# Patient Record
Sex: Female | Born: 1960 | Race: White | Hispanic: No | Marital: Married | Smoking: Never smoker
Health system: Southern US, Community
[De-identification: ages and names within clinical notes are randomized; demographics above are authoritative.]

## PROBLEM LIST (undated history)

## (undated) DIAGNOSIS — I10 Essential (primary) hypertension: Secondary | ICD-10-CM

## (undated) DIAGNOSIS — M1990 Unspecified osteoarthritis, unspecified site: Secondary | ICD-10-CM

## (undated) DIAGNOSIS — H268 Other specified cataract: Secondary | ICD-10-CM

## (undated) DIAGNOSIS — Z8742 Personal history of other diseases of the female genital tract: Secondary | ICD-10-CM

## (undated) DIAGNOSIS — D259 Leiomyoma of uterus, unspecified: Secondary | ICD-10-CM

## (undated) DIAGNOSIS — K219 Gastro-esophageal reflux disease without esophagitis: Secondary | ICD-10-CM

## (undated) DIAGNOSIS — Z8669 Personal history of other diseases of the nervous system and sense organs: Secondary | ICD-10-CM

## (undated) DIAGNOSIS — L900 Lichen sclerosus et atrophicus: Secondary | ICD-10-CM

## (undated) DIAGNOSIS — N949 Unspecified condition associated with female genital organs and menstrual cycle: Secondary | ICD-10-CM

## (undated) DIAGNOSIS — R112 Nausea with vomiting, unspecified: Secondary | ICD-10-CM

## (undated) DIAGNOSIS — N840 Polyp of corpus uteri: Secondary | ICD-10-CM

## (undated) DIAGNOSIS — N951 Menopausal and female climacteric states: Secondary | ICD-10-CM

## (undated) DIAGNOSIS — E785 Hyperlipidemia, unspecified: Secondary | ICD-10-CM

## (undated) DIAGNOSIS — Z9889 Other specified postprocedural states: Secondary | ICD-10-CM

## (undated) HISTORY — DX: Essential (primary) hypertension: I10

## (undated) HISTORY — PX: WISDOM TOOTH EXTRACTION: SHX21

## (undated) HISTORY — DX: Unspecified condition associated with female genital organs and menstrual cycle: N94.9

## (undated) HISTORY — DX: Lichen sclerosus et atrophicus: L90.0

## (undated) HISTORY — PX: LAPAROSCOPIC CHOLECYSTECTOMY: SUR755

## (undated) HISTORY — DX: Hyperlipidemia, unspecified: E78.5

## (undated) HISTORY — PX: OTHER SURGICAL HISTORY: SHX169

---

## 1998-08-19 ENCOUNTER — Encounter: Admission: RE | Admit: 1998-08-19 | Discharge: 1998-09-18 | Payer: Self-pay | Admitting: Family Medicine

## 1998-09-03 ENCOUNTER — Emergency Department (HOSPITAL_COMMUNITY): Admission: EM | Admit: 1998-09-03 | Discharge: 1998-09-03 | Payer: Self-pay | Admitting: Emergency Medicine

## 1998-09-03 ENCOUNTER — Encounter: Payer: Self-pay | Admitting: Emergency Medicine

## 2001-05-11 ENCOUNTER — Emergency Department (HOSPITAL_COMMUNITY): Admission: EM | Admit: 2001-05-11 | Discharge: 2001-05-11 | Payer: Self-pay | Admitting: Emergency Medicine

## 2001-05-13 ENCOUNTER — Emergency Department (HOSPITAL_COMMUNITY): Admission: EM | Admit: 2001-05-13 | Discharge: 2001-05-13 | Payer: Self-pay | Admitting: Emergency Medicine

## 2002-02-21 ENCOUNTER — Other Ambulatory Visit: Admission: RE | Admit: 2002-02-21 | Discharge: 2002-02-21 | Payer: Self-pay | Admitting: Obstetrics and Gynecology

## 2002-04-30 ENCOUNTER — Encounter: Payer: Self-pay | Admitting: Family Medicine

## 2002-04-30 ENCOUNTER — Encounter: Admission: RE | Admit: 2002-04-30 | Discharge: 2002-04-30 | Payer: Self-pay | Admitting: Family Medicine

## 2003-02-27 ENCOUNTER — Other Ambulatory Visit: Admission: RE | Admit: 2003-02-27 | Discharge: 2003-02-27 | Payer: Self-pay | Admitting: Obstetrics and Gynecology

## 2003-09-14 HISTORY — PX: APPENDECTOMY: SHX54

## 2004-03-02 ENCOUNTER — Other Ambulatory Visit: Admission: RE | Admit: 2004-03-02 | Discharge: 2004-03-02 | Payer: Self-pay | Admitting: Obstetrics and Gynecology

## 2004-07-24 ENCOUNTER — Encounter: Admission: RE | Admit: 2004-07-24 | Discharge: 2004-07-24 | Payer: Self-pay | Admitting: Obstetrics and Gynecology

## 2005-03-08 ENCOUNTER — Other Ambulatory Visit: Admission: RE | Admit: 2005-03-08 | Discharge: 2005-03-08 | Payer: Self-pay | Admitting: Obstetrics and Gynecology

## 2005-10-04 ENCOUNTER — Encounter: Admission: RE | Admit: 2005-10-04 | Discharge: 2005-10-04 | Payer: Self-pay | Admitting: Obstetrics and Gynecology

## 2006-03-14 ENCOUNTER — Other Ambulatory Visit: Admission: RE | Admit: 2006-03-14 | Discharge: 2006-03-14 | Payer: Self-pay | Admitting: Obstetrics & Gynecology

## 2006-10-05 ENCOUNTER — Encounter: Admission: RE | Admit: 2006-10-05 | Discharge: 2006-10-05 | Payer: Self-pay | Admitting: Obstetrics and Gynecology

## 2007-03-16 ENCOUNTER — Other Ambulatory Visit: Admission: RE | Admit: 2007-03-16 | Discharge: 2007-03-16 | Payer: Self-pay | Admitting: Obstetrics and Gynecology

## 2007-11-08 ENCOUNTER — Encounter: Admission: RE | Admit: 2007-11-08 | Discharge: 2007-11-08 | Payer: Self-pay | Admitting: Obstetrics and Gynecology

## 2008-09-10 ENCOUNTER — Other Ambulatory Visit: Admission: RE | Admit: 2008-09-10 | Discharge: 2008-09-10 | Payer: Self-pay | Admitting: Obstetrics and Gynecology

## 2010-07-24 ENCOUNTER — Encounter: Admission: RE | Admit: 2010-07-24 | Discharge: 2010-07-24 | Payer: Self-pay | Admitting: Obstetrics and Gynecology

## 2010-10-26 ENCOUNTER — Ambulatory Visit (HOSPITAL_COMMUNITY)
Admission: RE | Admit: 2010-10-26 | Discharge: 2010-10-26 | Disposition: A | Discharge status: Home / Self Care | Payer: BC Managed Care – PPO | Source: Ambulatory Visit | Attending: Obstetrics and Gynecology | Admitting: Obstetrics and Gynecology

## 2010-10-26 ENCOUNTER — Other Ambulatory Visit: Payer: Self-pay | Admitting: Obstetrics and Gynecology

## 2010-10-26 DIAGNOSIS — N840 Polyp of corpus uteri: Secondary | ICD-10-CM | POA: Insufficient documentation

## 2010-10-26 DIAGNOSIS — E43 Unspecified severe protein-calorie malnutrition: Secondary | ICD-10-CM | POA: Insufficient documentation

## 2010-10-26 LAB — CBC
HCT: 38.7 % (ref 36.0–46.0)
Hemoglobin: 12.1 g/dL (ref 12.0–15.0)
MCH: 24.9 pg — ABNORMAL LOW (ref 26.0–34.0)
MCV: 79.6 fL (ref 78.0–100.0)
RBC: 4.86 MIL/uL (ref 3.87–5.11)
WBC: 8.6 10*3/uL (ref 4.0–10.5)

## 2010-10-26 LAB — BASIC METABOLIC PANEL
CO2: 28 mEq/L (ref 19–32)
Chloride: 99 mEq/L (ref 96–112)
GFR calc non Af Amer: >60 mL/min (ref >60)
Glucose, Bld: 100 mg/dL — ABNORMAL HIGH (ref 70–99)
Potassium: 4.7 mEq/L (ref 3.5–5.1)
Sodium: 136 mEq/L (ref 135–145)

## 2010-10-28 NOTE — Op Note (Signed)
Sara Lyons, Sara Lyons               ACCOUNT NO.:  [id number]  MEDICAL RECORD NO.:  [id number]           PATIENT TYPE:  O  LOCATION:  [facility]                          FAC[facility]:  [facility]  PHYSICIAN:  Cynthia P. Romine, M.D.DATE OF BIRTH:  12-20-1960  DATE OF PROCEDURE:  10/26/2010 DATE OF DISCHARGE:                              OPERATIVE REPORT   PREOPERATIVE DIAGNOSES:  Menorrhagia, known endometrial polyps seen on sonohysterogram.  POSTOPERATIVE DIAGNOSIS:  Menorrhagia, known endometrial polyps seen on sonohysterogram.  PATHOLOGY:  Pending.  PROCEDURE:  Hysteroscopy with resection of polyps and dilation and curettage.  SURGEON:  Cynthia P. Romine, MD  ANESTHESIA:  General by LMA.  ESTIMATED BLOOD LOSS:  Minimal.  COMPLICATIONS:  None.  GLYCINE DEFICIT:  165 mL.  PROCEDURE IN DETAIL:  The patient was taken to the operating room and after the induction of adequate general anesthesia by LMA, she was placed in dorsal lithotomy position and prepped and draped in the usual fashion.  A posterior weighted and anterior Sims retractor were placed. The cervix was quite posterior leaf pacing and the vagina was very long and the Sims retractor would not reach to the cervix.  Therefore, it was removed.  A Deaver was placed and the cervix could be visualized and was grasped on its anterior lip.  It did not descend very much at all and would not move forward from its posterior facing position making the access to the cervical os somewhat difficult.  The uterus was sounded to 10 cm.  In order to dilate the cervix, there needed to be pressure placed towards the floor on the posterior weighted retractor, so that the os would come into view so that the dilators could be placed into the cervix and pointed anteriorly as the uterus was anteflexed.  With difficulty, the cervix was dilated to #31 Shawnie Pons.  The operative scope was introduced and hysteroscopy was done.  There were multiple endometrial  polyps present.  Photographic documentation was taken.  Most of them were on the anterior surface of the fundus and as the polyps were removed in pieces, it was necessary to withdraw the scope through the cervix in order to get the pieces of these rather large polyps out through the scope, which meant that was each passed with the cautery. It was necessary to withdraw the scope, then put the posterior weighted retractor back and pulled down the posterior weighted retractor, bring the cervix as forward as possible to reinsert the scope and remove the posterior weighted retractor, angle the scope anteriorly, and do another pass.  Using this technique and multiple passes, the polyps were removed and the endometrial cavity appeared clean.  Photographic documentation was taken at the end of the resection.  The scope was withdrawn.  Gentle sharp curettage was done.  The scope was then reinserted one more time.  The cavity was brushed clean with the cautery just removing small polyp fragments, and the procedure was terminated.  The scope waswithdrawn.  The instruments removed from the vagina and the procedure was terminated.  The patient tolerated it well.  Went in satisfactory condition to post anesthesia  recovery.     Cynthia P. Romine, M.D.     CPR/MEDQ  D:  10/26/2010  T:  10/27/2010  Job:  [id number]  Electronically Signed by Meredeth Ide M.D. on 10/28/2010 10:38:17 AM

## 2011-10-15 HISTORY — PX: COLONOSCOPY: SHX174

## 2012-06-12 ENCOUNTER — Other Ambulatory Visit: Payer: Self-pay | Admitting: Obstetrics and Gynecology

## 2012-06-12 DIAGNOSIS — Z1231 Encounter for screening mammogram for malignant neoplasm of breast: Secondary | ICD-10-CM

## 2012-06-30 ENCOUNTER — Ambulatory Visit
Admission: RE | Admit: 2012-06-30 | Discharge: 2012-06-30 | Disposition: A | Discharge status: Home / Self Care | Payer: BC Managed Care – PPO | Source: Ambulatory Visit | Attending: Obstetrics and Gynecology | Admitting: Obstetrics and Gynecology

## 2012-06-30 DIAGNOSIS — Z1231 Encounter for screening mammogram for malignant neoplasm of breast: Secondary | ICD-10-CM

## 2013-10-11 ENCOUNTER — Ambulatory Visit: Payer: Self-pay | Admitting: Nurse Practitioner

## 2013-10-29 ENCOUNTER — Ambulatory Visit (INDEPENDENT_AMBULATORY_CARE_PROVIDER_SITE_OTHER): Payer: BC Managed Care – PPO | Admitting: Nurse Practitioner

## 2013-10-29 ENCOUNTER — Encounter: Payer: Self-pay | Admitting: Nurse Practitioner

## 2013-10-29 VITALS — BP 130/82 | HR 88 | Ht 63.5 in | Wt 254.0 lb

## 2013-10-29 DIAGNOSIS — Z0000 Encounter for general adult medical examination without abnormal findings: Secondary | ICD-10-CM

## 2013-10-29 DIAGNOSIS — Z01419 Encounter for gynecological examination (general) (routine) without abnormal findings: Secondary | ICD-10-CM

## 2013-10-29 LAB — POCT URINALYSIS DIPSTICK
Bilirubin, UA: NEGATIVE
Blood, UA: NEGATIVE
Glucose, UA: NEGATIVE
Ketones, UA: NEGATIVE
LEUKOCYTES UA: NEGATIVE
Nitrite, UA: NEGATIVE
PH UA: 5
PROTEIN UA: NEGATIVE
UROBILINOGEN UA: NEGATIVE

## 2013-10-29 MED ORDER — MEDROXYPROGESTERONE ACETATE 10 MG PO TABS: 10.0000 mg | ORAL_TABLET | Freq: Every day | ORAL | Status: DC

## 2013-10-29 NOTE — Progress Notes (Signed)
Patient ID: Sara Lyons, female   DOB: 1961-04-11, 53 y.o.   MRN: [id number] 52 y.o. G0P0 Married Caucasian Fe here for annual exam.  Menses for this past year ws regular for about 2-3 months, then started skipping to every 3 months. LMP end of October.  Then Feb. 9 th another cycle. That lasted a 7 days and very heavy 4 days. Super pad and tampon changing often.  Associated breast tenderness, some PMS.  Still some vaso symptoms at night has not needed Estroven.  Adopted son is now age 36 and is having some health issues.    Patient's last menstrual period was 10/22/2013.          Sexually active: yes  The current method of family planning is post menopausal status.    Exercising: yes  Gym/ health club routine includes yoga.  Once weekly at the gym and 3-4 times per week at home. Smoker:  no  Health Maintenance: Pap:  10/04/11, WNL, neg HR HPV MMG:  06/30/12, Bi-Rads 1: negative Colonoscopy:  10/2011, diverticula, repeat in 5 years BMD: none TDaP:  UTD Labs:  PCP   Urine:  negative, pH 5.0   reports that she has never smoked. She has never used smokeless tobacco. She reports that she does not drink alcohol or use illicit drugs.  Past Medical History  Diagnosis Date  . Migraine   . Hypertension   . Hyperlipidemia     Past Surgical History  Procedure Laterality Date  . Cholecystectomy, laparoscopic      with nephrectomy  . Nephrectomy  11/21/08    with cholecystectomy  . Hysteroscopy  10/26/10    D&C, polyp resection, benign path  . Pelvic laparoscopy  11/21/08    exp lap with Left salpingo-oophorectomy  . Appendectomy  2005    Current Outpatient Prescriptions  Medication Sig Dispense Refill  . Multiple Vitamins-Minerals (MULTI FOR HER) PACK Take 1 each by mouth daily.      . pravastatin (PRAVACHOL) 20 MG tablet Take 1 tablet by mouth daily.      . traMADol (ULTRAM) 50 MG tablet Take 1 tablet by mouth daily as needed.      . triamterene-hydrochlorothiazide (MAXZIDE-25) 37.5-25  MG per tablet Take 1 tablet by mouth daily.      . medroxyPROGESTERone (PROVERA) 10 MG tablet Take 1 tablet (10 mg total) by mouth daily.  10 tablet  6   No current facility-administered medications for this visit.    Family History  Problem Relation Age of Onset  . Breast cancer Mother     after 25  . Esophageal cancer Mother 49  . Osteoporosis Mother   . Hypertension Mother   . Hypertension Father   . Heart attack Father   . Crohn's disease Sister   . Diabetes Sister   . Osteoporosis Maternal Grandmother     ROS:  Pertinent items are noted in HPI.  Otherwise, a comprehensive ROS was negative.  Exam:   BP 130/82  Pulse 88  Ht 5' 3.5" (1.613 m)  Wt 254 lb (115.214 kg)  BMI 44.28 kg/m2  LMP 10/22/2013 Height: 5' 3.5" (161.3 cm)  Ht Readings from Last 3 Encounters:  10/29/13 5' 3.5" (1.613 m)    General appearance: alert, cooperative and appears stated age Head: Normocephalic, without obvious abnormality, atraumatic Neck: no adenopathy, supple, symmetrical, trachea midline and thyroid normal to inspection and palpation Lungs: clear to auscultation bilaterally Breasts: normal appearance, no masses or tenderness Heart: regular rate and  rhythm Abdomen: soft, non-tender; no masses,  no organomegaly Extremities: extremities normal, atraumatic, no cyanosis or edema Skin: Skin color, texture, turgor normal. No rashes or lesions Lymph nodes: Cervical, supraclavicular, and axillary nodes normal. No abnormal inguinal nodes palpated Neurologic: Grossly normal   Pelvic: External genitalia:  no lesions              Urethra:  normal appearing urethra with no masses, tenderness or lesions              Bartholin's and Skene's: normal                 Vagina: normal appearing vagina with normal color and discharge, no lesions              Cervix: anteverted              Pap taken: no Bimanual Exam:  Uterus:  normal size, contour, position, consistency, mobility, non-tender               Adnexa: no mass, fullness, tenderness               Rectovaginal: Confirms               Anus:  normal sphincter tone, no lesions  A:  Well Woman with normal exam  Perimenopausal consistent with irregular menses  History of infertility secondary to endometriosis  S/P Exploratory lap W/ LSO secondary to dermoid cyst and endometriosis. 11/2008    P:   Pap smear as per guidelines   Mammogram due now and will schedule  Provera 10 mg to take every 60 days if no menses on her own - hopefully this will prevent such long and heavy bleeding on her own.  She will keep menstrual calendar and if no menses to call back after 2 rounds of Provera.  Counseled on breast self exam, mammography screening, adequate intake of calcium and vitamin D, diet and exercise return annually or prn  An After Visit Summary was printed and given to the patient.

## 2013-10-29 NOTE — Patient Instructions (Addendum)

## 2013-10-30 NOTE — Progress Notes (Signed)
Encounter reviewed by Dr. Shenelle Klas Silva.  

## 2014-04-23 ENCOUNTER — Other Ambulatory Visit: Payer: Self-pay

## 2014-04-23 DIAGNOSIS — Z1231 Encounter for screening mammogram for malignant neoplasm of breast: Secondary | ICD-10-CM

## 2014-04-25 ENCOUNTER — Ambulatory Visit
Admission: RE | Admit: 2014-04-25 | Discharge: 2014-04-25 | Disposition: A | Discharge status: Home / Self Care | Payer: BC Managed Care – PPO | Source: Ambulatory Visit

## 2014-04-25 DIAGNOSIS — Z1231 Encounter for screening mammogram for malignant neoplasm of breast: Secondary | ICD-10-CM

## 2014-11-04 ENCOUNTER — Ambulatory Visit (INDEPENDENT_AMBULATORY_CARE_PROVIDER_SITE_OTHER): Payer: BC Managed Care – PPO | Admitting: Nurse Practitioner

## 2014-11-04 ENCOUNTER — Encounter: Payer: Self-pay | Admitting: Nurse Practitioner

## 2014-11-04 VITALS — BP 136/60 | HR 64 | Resp 16 | Ht 63.75 in | Wt 219.2 lb

## 2014-11-04 DIAGNOSIS — Z0000 Encounter for general adult medical examination without abnormal findings: Secondary | ICD-10-CM

## 2014-11-04 DIAGNOSIS — Z01419 Encounter for gynecological examination (general) (routine) without abnormal findings: Secondary | ICD-10-CM

## 2014-11-04 LAB — POCT URINALYSIS DIPSTICK
BILIRUBIN UA: NEGATIVE
GLUCOSE UA: NEGATIVE
KETONES UA: NEGATIVE
LEUKOCYTES UA: NEGATIVE
Nitrite, UA: NEGATIVE
PH UA: 5
Protein, UA: NEGATIVE
RBC UA: NEGATIVE
Urobilinogen, UA: NEGATIVE

## 2014-11-04 NOTE — Progress Notes (Signed)
54 y.o. G0P0 Married  Caucasian Fe here for annual exam. She has a history of irregular menses from perimenopause.  Vaso symptoms are now improved.  currently amenorrhea since early August. Then did not take Provera - she thought of it then she forgot since she was at work and [employer] were at home.  Then on 10/27/14 had a 4 day menses on her own with very heavy bleeding X 1 day.  She will now put on her calender and menses app.  She is aware again if no menses in 3 months to do Provera challenge.  Patient's last menstrual period was 10/27/2014.          Sexually active: Yes.    The current method of family planning is none.    Exercising: Yes.    walking Smoker:  no  Health Maintenance: Pap:  10/04/11 WNL/negative HR HPV MMG:  04/25/14-normal Colonoscopy:  2/13-diverticular repeat in 5 years TDaP:  PCP-UTD Labs: PCP  Urine-Negative   reports that she has never smoked. She has never used smokeless tobacco. She reports that she drinks alcohol. She reports that she does not use illicit drugs.  Past Medical History  Diagnosis Date  . Migraine   . Hypertension   . Hyperlipidemia     Past Surgical History  Procedure Laterality Date  . Cholecystectomy, laparoscopic      with Left oophorectomy  . Hysteroscopy  10/26/10    D&C, polyp resection, benign path  . Appendectomy  2005  . Pelvic laparoscopy  11/21/08    exp lap with Left salpingo-oophorectomy    Current Outpatient Prescriptions  Medication Sig Dispense Refill  . Multiple Vitamins-Minerals (MULTI FOR HER) PACK Take 1 each by mouth daily.    . Omeprazole 20 MG TBEC daily.    . pravastatin (PRAVACHOL) 20 MG tablet Take 1 tablet by mouth daily.    Marland Kitchen triamterene-hydrochlorothiazide (MAXZIDE-25) 37.5-25 MG per tablet Take 1 tablet by mouth daily.    . medroxyPROGESTERone (PROVERA) 10 MG tablet Take 1 tablet (10 mg total) by mouth daily. (Patient not taking: Reported on 11/04/2014) 10 tablet 6  . traMADol (ULTRAM) 50 MG tablet Take 1  tablet by mouth daily as needed.     No current facility-administered medications for this visit.    Family History  Problem Relation Age of Onset  . Breast cancer Mother     after 67  . Esophageal cancer Mother 26  . Osteoporosis Mother   . Hypertension Mother   . Hypertension Father   . Heart attack Father   . Crohn's disease Sister   . Diabetes Sister   . Osteoporosis Maternal Grandmother     ROS:  Pertinent items are noted in HPI.  Otherwise, a comprehensive ROS was negative.  Exam:   BP 136/60 mmHg  Pulse 64  Resp 16  Ht 5' 3.75" (1.619 m)  Wt 219 lb 3.2 oz (99.428 kg)  BMI 37.93 kg/m2  LMP 10/27/2014 Height: 5' 3.75" (161.9 cm) Ht Readings from Last 3 Encounters:  11/04/14 5' 3.75" (1.619 m)  10/29/13 5' 3.5" (1.613 m)    General appearance: alert, cooperative and appears stated age Head: Normocephalic, without obvious abnormality, atraumatic Neck: no adenopathy, supple, symmetrical, trachea midline and thyroid normal to inspection and palpation Lungs: clear to auscultation bilaterally Breasts: normal appearance, no masses or tenderness Heart: regular rate and rhythm Abdomen: soft, non-tender; no masses,  no organomegaly Extremities: extremities normal, atraumatic, no cyanosis or edema Skin: Skin color, texture, turgor normal.  No rashes or lesions Lymph nodes: Cervical, supraclavicular, and axillary nodes normal. No abnormal inguinal nodes palpated Neurologic: Grossly normal   Pelvic: External genitalia:  no lesions              Urethra:  normal appearing urethra with no masses, tenderness or lesions              Bartholin's and Skene's: normal                 Vagina: normal appearing vagina with normal color and discharge, no lesions              Cervix: anteverted              Pap taken: Yes.   Bimanual Exam:  Uterus:  normal size, contour, position, consistency, mobility, non-tender              Adnexa: no mass, fullness, tenderness                Rectovaginal: Confirms               Anus:  normal sphincter tone, no lesions  Chaperone present: Np  A:  Well Woman with normal exam  Perimenopausal consistent with irregular menses History of infertility secondary to endometriosis S/P Exploratory lap W/ LSO secondary to dermoid cyst and endometriosis. 11/2008   P:   Reviewed health and wellness pertinent to exam  Pap smear taken today  Mammogram is due 8/16  Has Provera on hand at home - she will take Provera challenge if no menses in 3 months.  Counseled on breast self exam, mammography screening, adequate intake of calcium and vitamin D, diet and exercise, Kegel's exercises return annually or prn  An After Visit Summary was printed and given to the patient.

## 2014-11-04 NOTE — Patient Instructions (Signed)

## 2014-11-05 NOTE — Progress Notes (Signed)
Encounter reviewed by Dr. Brook Silva.  

## 2014-11-06 LAB — IPS PAP TEST WITH HPV

## 2015-11-10 ENCOUNTER — Ambulatory Visit (INDEPENDENT_AMBULATORY_CARE_PROVIDER_SITE_OTHER): Payer: BC Managed Care – PPO | Admitting: Nurse Practitioner

## 2015-11-10 ENCOUNTER — Encounter: Payer: Self-pay | Admitting: Nurse Practitioner

## 2015-11-10 VITALS — BP 120/72 | HR 88 | Ht 63.75 in | Wt 232.0 lb

## 2015-11-10 DIAGNOSIS — N951 Menopausal and female climacteric states: Secondary | ICD-10-CM

## 2015-11-10 DIAGNOSIS — Z0000 Encounter for general adult medical examination without abnormal findings: Secondary | ICD-10-CM

## 2015-11-10 DIAGNOSIS — Z01419 Encounter for gynecological examination (general) (routine) without abnormal findings: Secondary | ICD-10-CM

## 2015-11-10 LAB — POCT URINALYSIS DIPSTICK
Bilirubin, UA: NEGATIVE
Blood, UA: NEGATIVE
Glucose, UA: NEGATIVE
KETONES UA: NEGATIVE
LEUKOCYTES UA: NEGATIVE
NITRITE UA: NEGATIVE
PH UA: 6.5
PROTEIN UA: NEGATIVE
UROBILINOGEN UA: NEGATIVE

## 2015-11-10 NOTE — Patient Instructions (Addendum)

## 2015-11-10 NOTE — Progress Notes (Signed)
Patient ID: Sara Lyons, female   DOB: Nov 18, 1960, 55 y.o.   MRN: [id number]  55 y.o. G0P0000 Married  Caucasian Fe here for annual exam.  Still having a menses every 3-4 months. 4 cycles this past year.  Lasting normally 3-4 days.  This past 2 cycles very long at 8 days. And flow was heavy.  Last PUS was about 2010.  Also has PMS symptoms and this occurs 1-2 days prior and few days of cycle.  She has history of adenomyosis.  She has Provera on hand if she goes over 3- 4 months and know she will do Provera challenge and call.  Patient's last menstrual period was 09/16/2015 (exact date).          Sexually active: Yes.    The current method of family planning is none.    Exercising: Yes.    Gym/ health club routine includes yoga 2-3 times per week. Smoker:  no  Health Maintenance: Pap: 11/04/14, Negative with neg HR HPV MMG: 04/25/14, Bi-Rads 1: Negative Colonoscopy:  10/2011, diverticula, repeat in 5 years TDaP:  UTD - will get date from PCP Hep C and HIV: will do at PCP and send [place] results Labs: PCP takes care of all labs  Urine:  Negative   reports that she has never smoked. She has never used smokeless tobacco. She reports that she drinks alcohol. She reports that she does not use illicit drugs.  Past Medical History  Diagnosis Date  . Migraine   . Hypertension   . Hyperlipidemia     Past Surgical History  Procedure Laterality Date  . Cholecystectomy, laparoscopic      with Left oophorectomy  . Hysteroscopy  10/26/10    D&C, polyp resection, benign path  . Appendectomy  2005  . Pelvic laparoscopy  11/21/08    exp lap with Left salpingo-oophorectomy    Current Outpatient Prescriptions  Medication Sig Dispense Refill  . medroxyPROGESTERone (PROVERA) 10 MG tablet Take 1 tablet (10 mg total) by mouth daily. 10 tablet 6  . Multiple Vitamins-Minerals (MULTI FOR HER) PACK Take 1 each by mouth daily.    . Omeprazole 20 MG TBEC daily.    . pravastatin (PRAVACHOL) 20 MG tablet Take 1  tablet by mouth daily.    . traMADol (ULTRAM) 50 MG tablet Take 1 tablet by mouth daily as needed.    . triamterene-hydrochlorothiazide (MAXZIDE-25) 37.5-25 MG per tablet Take 1 tablet by mouth daily.     No current facility-administered medications for this visit.    Family History  Problem Relation Age of Onset  . Breast cancer Mother     after 40  . Esophageal cancer Mother 66  . Osteoporosis Mother   . Hypertension Mother   . Hypertension Father   . Heart attack Father   . Crohn's disease Sister   . Diabetes Sister   . Osteoporosis Maternal Grandmother     ROS:  Pertinent items are noted in HPI.  Otherwise, a comprehensive ROS was negative.  Exam:   BP 120/72 mmHg  Pulse 88  Ht 5' 3.75" (1.619 m)  Wt 232 lb (105.235 kg)  BMI 40.15 kg/m2  LMP 09/16/2015 (Exact Date) Height: 5' 3.75" (161.9 cm) Ht Readings from Last 3 Encounters:  11/10/15 5' 3.75" (1.619 m)  11/04/14 5' 3.75" (1.619 m)  10/29/13 5' 3.5" (1.613 m)    General appearance: alert, cooperative and appears stated age Head: Normocephalic, without obvious abnormality, atraumatic Neck: no adenopathy, supple, symmetrical, trachea  midline and thyroid normal to inspection and palpation Lungs: clear to auscultation bilaterally Breasts: normal appearance, no masses or tenderness Heart: regular rate and rhythm Abdomen: soft, non-tender; no masses,  no organomegaly Extremities: extremities normal, atraumatic, no cyanosis or edema Skin: Skin color, texture, turgor normal. No rashes or lesions Lymph nodes: Cervical, supraclavicular, and axillary nodes normal. No abnormal inguinal nodes palpated Neurologic: Grossly normal   Pelvic: External genitalia:  no lesions              Urethra:  normal appearing urethra with no masses, tenderness or lesions              Bartholin's and Skene's: normal                 Vagina: normal appearing vagina with normal color and discharge, no lesions              Cervix:  anteverted              Pap taken: No. Bimanual Exam:  Uterus:  normal size, contour, position, consistency, mobility, non-tender              Adnexa: no mass, fullness, tenderness               Rectovaginal: Confirms               Anus:  normal sphincter tone, no lesions  Chaperone present: no  A:  Well Woman with normal exam  Perimenopausal consistent with irregular menses History of infertility secondary to endometriosis S/P Exploratory lap W/ LSO secondary to dermoid cyst and endometriosis. 11/2008   P:   Reviewed health and wellness pertinent to exam  Pap smear as above  Mammogram is due and will call to schedule  I have discussed her cycles with Dr. Quincy Simmonds  Continue to monitor menses  She declines any need for med's for PMS  Counseled on breast self exam, mammography screening, adequate intake of calcium and vitamin D, diet and exercise return annually or prn  An After Visit Summary was printed and given to the patient.

## 2015-11-10 NOTE — Progress Notes (Signed)
Encounter reviewed by Dr. Toshiyuki Fredell Amundson C. Silva.  

## 2016-02-01 ENCOUNTER — Encounter (HOSPITAL_BASED_OUTPATIENT_CLINIC_OR_DEPARTMENT_OTHER): Payer: Self-pay

## 2016-02-01 ENCOUNTER — Emergency Department (HOSPITAL_BASED_OUTPATIENT_CLINIC_OR_DEPARTMENT_OTHER): Payer: BC Managed Care – PPO

## 2016-02-01 ENCOUNTER — Emergency Department (HOSPITAL_BASED_OUTPATIENT_CLINIC_OR_DEPARTMENT_OTHER)
Admission: EM | Admit: 2016-02-01 | Discharge: 2016-02-01 | Disposition: A | Discharge status: Home / Self Care | Payer: BC Managed Care – PPO | Attending: Emergency Medicine | Admitting: Emergency Medicine

## 2016-02-01 DIAGNOSIS — R0789 Other chest pain: Secondary | ICD-10-CM | POA: Insufficient documentation

## 2016-02-01 DIAGNOSIS — E785 Hyperlipidemia, unspecified: Secondary | ICD-10-CM | POA: Diagnosis not present

## 2016-02-01 DIAGNOSIS — I10 Essential (primary) hypertension: Secondary | ICD-10-CM | POA: Insufficient documentation

## 2016-02-01 DIAGNOSIS — Z79899 Other long term (current) drug therapy: Secondary | ICD-10-CM | POA: Insufficient documentation

## 2016-02-01 DIAGNOSIS — R51 Headache: Secondary | ICD-10-CM | POA: Diagnosis not present

## 2016-02-01 DIAGNOSIS — R079 Chest pain, unspecified: Secondary | ICD-10-CM

## 2016-02-01 DIAGNOSIS — R519 Headache, unspecified: Secondary | ICD-10-CM

## 2016-02-01 LAB — BASIC METABOLIC PANEL
ANION GAP: 9 (ref 5–15)
BUN: 17 mg/dL (ref 6–20)
CALCIUM: 9.1 mg/dL (ref 8.9–10.3)
CO2: 28 mmol/L (ref 22–32)
Chloride: 99 mmol/L — ABNORMAL LOW (ref 101–111)
Creatinine, Ser: 0.61 mg/dL (ref 0.44–1.00)
GFR calc Af Amer: >60 mL/min (ref >60)
GLUCOSE: 114 mg/dL — AB (ref 65–99)
Potassium: 3.2 mmol/L — ABNORMAL LOW (ref 3.5–5.1)
SODIUM: 136 mmol/L (ref 135–145)

## 2016-02-01 LAB — CBC
HCT: 38.8 % (ref 36.0–46.0)
HEMOGLOBIN: 12.8 g/dL (ref 12.0–15.0)
MCH: 27.5 pg (ref 26.0–34.0)
MCHC: 33 g/dL (ref 30.0–36.0)
MCV: 83.3 fL (ref 78.0–100.0)
Platelets: 256 10*3/uL (ref 150–400)
RBC: 4.66 MIL/uL (ref 3.87–5.11)
RDW: 14.7 % (ref 11.5–15.5)
WBC: 11.2 10*3/uL — ABNORMAL HIGH (ref 4.0–10.5)

## 2016-02-01 LAB — TROPONIN I

## 2016-02-01 MED ORDER — ASPIRIN 81 MG PO CHEW
324.0000 mg | CHEWABLE_TABLET | Freq: Once | ORAL | Status: AC
  Administered 2016-02-01: 324 mg via ORAL
  Filled 2016-02-01: qty 4

## 2016-02-01 MED ORDER — ACETAMINOPHEN 500 MG PO TABS
1000.0000 mg | ORAL_TABLET | Freq: Once | ORAL | Status: AC
  Administered 2016-02-01: 1000 mg via ORAL
  Filled 2016-02-01: qty 2

## 2016-02-01 MED ORDER — OMEPRAZOLE 20 MG PO CPDR: 20.0000 mg | DELAYED_RELEASE_CAPSULE | Freq: Two times a day (BID) | ORAL | Status: AC

## 2016-02-01 MED ORDER — GI COCKTAIL ~~LOC~~
30.0000 mL | Freq: Once | ORAL | Status: AC
  Administered 2016-02-01: 30 mL via ORAL
  Filled 2016-02-01: qty 30

## 2016-02-01 MED ORDER — FAMOTIDINE 20 MG PO TABS
20.0000 mg | ORAL_TABLET | Freq: Once | ORAL | Status: AC
  Administered 2016-02-01: 20 mg via ORAL
  Filled 2016-02-01: qty 1

## 2016-02-01 NOTE — ED Provider Notes (Signed)
CSN: [id number]     Arrival date & time 02/01/16  1435 History   First MD Initiated Contact with Patient 02/01/16 1643     Chief Complaint  Patient presents with  . Chest Pain     (Consider location/radiation/quality/duration/timing/severity/associated sxs/prior Treatment) HPI At [place] started feeling "yucky". Nausea, H/A. Stopped and ate b/c thought might be b/c had not eaten since breakfast (oatmeat, blueberries, banana). At 1:30 ate cheeze burger, diet Pepsi and fries. Got chest tightness that worsened about 20 minutes after ate. Drank a diet Pepsi to see if would help. Got radiation to left arm. Symptoms got bad quickly. Went from nausea to severe pain in few minutes. Now has eased but still feels some residual discomfort. Patient was well before symptoms started. She has no heart history. Past Medical History  Diagnosis Date  . Migraine   . Hypertension   . Hyperlipidemia    Past Surgical History  Procedure Laterality Date  . Cholecystectomy, laparoscopic      with Left oophorectomy  . Hysteroscopy  10/26/10    D&C, polyp resection, benign path  . Appendectomy  2005  . Pelvic laparoscopy  11/21/08    exp lap with Left salpingo-oophorectomy   Family History  Problem Relation Age of Onset  . Breast cancer Mother     after 24  . Esophageal cancer Mother 63  . Osteoporosis Mother   . Hypertension Mother   . Hypertension Father   . Heart attack Father   . Crohn's disease Sister   . Diabetes Sister   . Osteoporosis Maternal Grandmother    Social History  Substance Use Topics  . Smoking status: Never Smoker   . Smokeless tobacco: Never Used  . Alcohol Use: 0.0 oz/week    0 Standard drinks or equivalent per week     Comment: less than one   OB History    Gravida Para Term Preterm AB TAB SAB Ectopic Multiple Living   0 0 0 0 0 0 0 0 0 0      Review of Systems 10 Systems reviewed and are negative for acute change except as noted in the HPI.  Allergies   Demerol  Home Medications   Prior to Admission medications   Medication Sig Start Date End Date Taking? Authorizing Provider  medroxyPROGESTERone (PROVERA) 10 MG tablet Take 1 tablet (10 mg total) by mouth daily. 10/29/13   Kem Boroughs, FNP  Multiple Vitamins-Minerals (MULTI FOR HER) PACK Take 1 each by mouth daily.    Historical Provider, MD  omeprazole (PRILOSEC) 20 MG capsule Take 1 capsule (20 mg total) by mouth 2 (two) times daily before a meal. 02/01/16   Charlesetta Shanks, MD  Omeprazole 20 MG TBEC daily. 09/08/11   Historical Provider, MD  pravastatin (PRAVACHOL) 20 MG tablet Take 1 tablet by mouth daily. 10/01/13   Historical Provider, MD  traMADol (ULTRAM) 50 MG tablet Take 1 tablet by mouth daily as needed. 10/10/13   Historical Provider, MD  triamterene-hydrochlorothiazide (MAXZIDE-25) 37.5-25 MG per tablet Take 1 tablet by mouth daily. 10/01/13   Historical Provider, MD   BP 155/74 mmHg  Pulse 93  Temp(Src) 98.7 F (37.1 C) (Oral)  Resp 13  Ht 5\' 4"  (1.626 m)  Wt 235 lb (106.595 kg)  BMI 40.32 kg/m2  SpO2 98%  LMP 02/01/2015 (Within Months) Physical Exam  Constitutional: She is oriented to person, place, and time. She appears well-developed and well-nourished.  HENT:  Head: Normocephalic and atraumatic.  Mouth/Throat: Oropharynx is clear  and moist.  Eyes: EOM are normal. Pupils are equal, round, and reactive to light.  Neck: Neck supple.  Cardiovascular: Normal rate, regular rhythm, normal heart sounds and intact distal pulses.   Pulmonary/Chest: Effort normal and breath sounds normal. She exhibits no tenderness.  Abdominal: Soft. Bowel sounds are normal. She exhibits no distension. There is no tenderness.  Musculoskeletal: Normal range of motion. She exhibits no edema or tenderness.  Neurological: She is alert and oriented to person, place, and time. She has normal strength. Coordination normal. GCS eye subscore is 4. GCS verbal subscore is 5. GCS motor subscore is 6.   Skin: Skin is warm, dry and intact.  Psychiatric: She has a normal mood and affect.    ED Course  Procedures (including critical care time) Labs Review Labs Reviewed  BASIC METABOLIC PANEL - Abnormal; Notable for the following:    Potassium 3.2 (*)    Chloride 99 (*)    Glucose, Bld 114 (*)    All other components within normal limits  CBC - Abnormal; Notable for the following:    WBC 11.2 (*)    All other components within normal limits  TROPONIN I  TROPONIN I    Imaging Review Dg Chest 2 View  02/01/2016  CLINICAL DATA:  Central chest pain, pressure onset today. Pain radiating into left arm and neck. Some shortness of breath. EXAM: CHEST  2 VIEW COMPARISON:  Chest x-ray dated 11/20/2014. FINDINGS: Heart size is normal. Overall cardiomediastinal silhouette is stable in size and configuration. Lungs are clear. No evidence of pneumonia. No pleural effusion or pneumothorax seen. Mild degenerative spurring noted within the thoracic spine. No acute- appearing osseous abnormality. IMPRESSION: Lungs are clear and there is no evidence of acute cardiopulmonary abnormality. Electronically Signed   By: Franki Cabot M.D.   On: 02/01/2016 15:38   I have personally reviewed and evaluated these images and lab results as part of my medical decision-making.   EKG Interpretation   Date/Time:  Sunday Feb 01 2016 14:41:28 EDT Ventricular Rate:  90 PR Interval:  160 QRS Duration: 82 QT Interval:  338 QTC Calculation: 413 R Axis:   40 Text Interpretation:  Normal sinus rhythm normal. no change from old  Confirmed by Johnney Killian, MD, Jeannie Done [phone]) on 02/01/2016 4:56:47 PM      MDM   Final diagnoses:  Chest pain, unspecified chest pain type  Acute nonintractable headache, unspecified headache type   Patient developed chest pains outlined above. EKG does not show ischemic changes. 2 sets of cardiac enzymes are negative. At this time she does not appear to have acute ischemic event. I will have  the patient follow-up with her family doctor this week to determine necessity of follow-up stress testing. Patient also has history of reflux, consideration is given to an exacerbation with esophageal spasm. The patient will be advised to increase her omeprazole to twice daily for the next 2 weeks. Signs and symptoms for which to return are reviewed.    Charlesetta Shanks, MD 02/01/16 [phone]

## 2016-02-01 NOTE — ED Notes (Signed)
Pt c/o H/A "not migraine H/A".

## 2016-02-01 NOTE — ED Notes (Signed)
Pt reports chest tightness x 2 hours. Sts having centralized to left sided chest pain. Reports some nausea. Also sts some radiation into neck.

## 2016-02-01 NOTE — Discharge Instructions (Signed)
Nonspecific Chest Pain  Chest pain can be caused by many different conditions. There is always a chance that your pain could be related to something serious, such as a heart attack or a blood clot in your lungs. Chest pain can also be caused by conditions that are not life-threatening. If you have chest pain, it is very important to follow up with your health care provider. CAUSES  Chest pain can be caused by:  Heartburn.  Pneumonia or bronchitis.  Anxiety or stress.  Inflammation around your heart (pericarditis) or lung (pleuritis or pleurisy).  A blood clot in your lung.  A collapsed lung (pneumothorax). It can develop suddenly on its own (spontaneous pneumothorax) or from trauma to the chest.  Shingles infection (varicella-zoster virus).  Heart attack.  Damage to the bones, muscles, and cartilage that make up your chest wall. This can include:  Bruised bones due to injury.  Strained muscles or cartilage due to frequent or repeated coughing or overwork.  Fracture to one or more ribs.  Sore cartilage due to inflammation (costochondritis). RISK FACTORS  Risk factors for chest pain may include:  Activities that increase your risk for trauma or injury to your chest.  Respiratory infections or conditions that cause frequent coughing.  Medical conditions or overeating that can cause heartburn.  Heart disease or family history of heart disease.  Conditions or health behaviors that increase your risk of developing a blood clot.  Having had chicken pox (varicella zoster). SIGNS AND SYMPTOMS Chest pain can feel like:  Burning or tingling on the surface of your chest or deep in your chest.  Crushing, pressure, aching, or squeezing pain.  Dull or sharp pain that is worse when you move, cough, or take a deep breath.  Pain that is also felt in your back, neck, shoulder, or arm, or pain that spreads to any of these areas. Your chest pain may come and go, or it may stay  constant. DIAGNOSIS Lab tests or other studies may be needed to find the cause of your pain. Your health care provider may have you take a test called an ambulatory ECG (electrocardiogram). An ECG records your heartbeat patterns at the time the test is performed. You may also have other tests, such as:  Transthoracic echocardiogram (TTE). During echocardiography, sound waves are used to create a picture of all of the heart structures and to look at how blood flows through your heart.  Transesophageal echocardiogram (TEE).This is a more advanced imaging test that obtains images from inside your body. It allows your health care provider to see your heart in finer detail.  Cardiac monitoring. This allows your health care provider to monitor your heart rate and rhythm in real time.  Holter monitor. This is a portable device that records your heartbeat and can help to diagnose abnormal heartbeats. It allows your health care provider to track your heart activity for several days, if needed.  Stress tests. These can be done through exercise or by taking medicine that makes your heart beat more quickly.  Blood tests.  Imaging tests. TREATMENT  Your treatment depends on what is causing your chest pain. Treatment may include:  Medicines. These may include:  Acid blockers for heartburn.  Anti-inflammatory medicine.  Pain medicine for inflammatory conditions.  Antibiotic medicine, if an infection is present.  Medicines to dissolve blood clots.  Medicines to treat coronary artery disease.  Supportive care for conditions that do not require medicines. This may include:  Resting.  Applying heat  or cold packs to injured areas.  Limiting activities until pain decreases. HOME CARE INSTRUCTIONS  If you were prescribed an antibiotic medicine, finish it all even if you start to feel better.  Avoid any activities that bring on chest pain.  Do not use any tobacco products, including  cigarettes, chewing tobacco, or electronic cigarettes. If you need help quitting, ask your health care provider.  Do not drink alcohol.  Take medicines only as directed by your health care provider.  Keep all follow-up visits as directed by your health care provider. This is important. This includes any further testing if your chest pain does not go away.  If heartburn is the cause for your chest pain, you may be told to keep your head raised (elevated) while sleeping. This reduces the chance that acid will go from your stomach into your esophagus.  Make lifestyle changes as directed by your health care provider. These may include:  Getting regular exercise. Ask your health care provider to suggest some activities that are safe for you.  Eating a heart-healthy diet. A registered dietitian can help you to learn healthy eating options.  Maintaining a healthy weight.  Managing diabetes, if necessary.  Reducing stress. SEEK MEDICAL CARE IF:  Your chest pain does not go away after treatment.  You have a rash with blisters on your chest.  You have a fever. SEEK IMMEDIATE MEDICAL CARE IF:   Your chest pain is worse.  You have an increasing cough, or you cough up blood.  You have severe abdominal pain.  You have severe weakness.  You faint.  You have chills.  You have sudden, unexplained chest discomfort.  You have sudden, unexplained discomfort in your arms, back, neck, or jaw.  You have shortness of breath at any time.  You suddenly start to sweat, or your skin gets clammy.  You feel nauseous or you vomit.  You suddenly feel light-headed or dizzy.  Your heart begins to beat quickly, or it feels like it is skipping beats. These symptoms may represent a serious problem that is an emergency. Do not wait to see if the symptoms will go away. Get medical help right away. Call your local emergency services (911 in the [place] Do not drive yourself to the hospital.   This  information is not intended to replace advice given to you by your health care provider. Make sure you discuss any questions you have with your health care provider.   Document Released: 06/09/2005 Document Revised: 09/20/2014 Document Reviewed: 04/05/2014 Elsevier Interactive Patient Education 2016 [place] Headache Without Cause A headache is pain or discomfort felt around the head or neck area. The specific cause of a headache may not be found. There are many causes and types of headaches. A few common ones are:  Tension headaches.  Migraine headaches.  Cluster headaches.  Chronic daily headaches. HOME CARE INSTRUCTIONS  Watch your condition for any changes. Take these steps to help with your condition: Managing Pain  Take over-the-counter and prescription medicines only as told by your health care provider.  Lie down in a dark, quiet room when you have a headache.  If directed, apply ice to the head and neck area:  Put ice in a plastic bag.  Place a towel between your skin and the bag.  Leave the ice on for 20 minutes, 2-3 times per day.  Use a heating pad or hot shower to apply heat to the head and neck area as told by  your health care provider.  Keep lights dim if bright lights bother you or make your headaches worse. Eating and Drinking  Eat meals on a regular schedule.  Limit alcohol use.  Decrease the amount of caffeine you drink, or stop drinking caffeine. General Instructions  Keep all follow-up visits as told by your health care provider. This is important.  Keep a headache journal to help find out what may trigger your headaches. For example, write down:  What you eat and drink.  How much sleep you get.  Any change to your diet or medicines.  Try massage or other relaxation techniques.  Limit stress.  Sit up straight, and do not tense your muscles.  Do not use tobacco products, including cigarettes, chewing tobacco, or e-cigarettes.  If you need help quitting, ask your health care provider.  Exercise regularly as told by your health care provider.  Sleep on a regular schedule. Get 7-9 hours of sleep, or the amount recommended by your health care provider. SEEK MEDICAL CARE IF:   Your symptoms are not helped by medicine.  You have a headache that is different from the usual headache.  You have nausea or you vomit.  You have a fever. SEEK IMMEDIATE MEDICAL CARE IF:   Your headache becomes severe.  You have repeated vomiting.  You have a stiff neck.  You have a loss of vision.  You have problems with speech.  You have pain in the eye or ear.  You have muscular weakness or loss of muscle control.  You lose your balance or have trouble walking.  You feel faint or pass out.  You have confusion.   This information is not intended to replace advice given to you by your health care provider. Make sure you discuss any questions you have with your health care provider.   Document Released: 08/30/2005 Document Revised: 05/21/2015 Document Reviewed: 12/23/2014 Elsevier Interactive Patient Education [employer].

## 2016-03-13 HISTORY — PX: CATARACT EXTRACTION W/ INTRAOCULAR LENS IMPLANT: SHX1309

## 2016-09-13 DIAGNOSIS — N949 Unspecified condition associated with female genital organs and menstrual cycle: Secondary | ICD-10-CM

## 2016-09-13 DIAGNOSIS — L900 Lichen sclerosus et atrophicus: Secondary | ICD-10-CM

## 2016-09-13 HISTORY — DX: Unspecified condition associated with female genital organs and menstrual cycle: N94.9

## 2016-09-13 HISTORY — DX: Lichen sclerosus et atrophicus: L90.0

## 2016-09-22 ENCOUNTER — Other Ambulatory Visit: Payer: Self-pay | Admitting: Nurse Practitioner

## 2016-09-22 DIAGNOSIS — Z1231 Encounter for screening mammogram for malignant neoplasm of breast: Secondary | ICD-10-CM

## 2016-10-05 ENCOUNTER — Ambulatory Visit: Payer: BC Managed Care – PPO

## 2016-10-29 ENCOUNTER — Ambulatory Visit
Admission: RE | Admit: 2016-10-29 | Discharge: 2016-10-29 | Disposition: A | Discharge status: Home / Self Care | Payer: BC Managed Care – PPO | Source: Ambulatory Visit | Attending: Nurse Practitioner | Admitting: Nurse Practitioner

## 2016-10-29 DIAGNOSIS — Z1231 Encounter for screening mammogram for malignant neoplasm of breast: Secondary | ICD-10-CM

## 2016-11-09 ENCOUNTER — Encounter: Payer: Self-pay | Admitting: Nurse Practitioner

## 2016-11-09 NOTE — Progress Notes (Signed)
Patient ID: Sara Lyons, female   DOB: [birth year], 56 y.o.   MRN: [id number]  55 y.o. G0P0000 Married  Caucasian Fe here for annual exam.  Now amenorrhea since 11/2015.  She did not do Provera challenge but is willing to do now.  The vaso symptoms have almost resolved.  Last PUS was about 2010. She and husband have been very stressed dealing with her in laws death this past year.  Mother in law in the spring and father in law in the fall.   Patient's last menstrual period was 11/26/2015.          Sexually active: No.  The current method of family planning is post menopausal status.    Exercising: No.  walks a lot at her job Smoker:  no  Health Maintenance: Pap: 11/04/14, Negative with neg HR HPV  10/04/11, Negative with neg HR HPV MMG: 10/29/16, 3D, Bi-Rads 1: Negative Colonoscopy: 11/10/11, diverticula, repeat in 10 years TDaP: UTD Labs: PCP takes care of all labs (10/2016 in [place])   reports that she has never smoked. She has never used smokeless tobacco. She reports that she drinks alcohol. She reports that she does not use drugs.  Past Medical History:  Diagnosis Date  . Hyperlipidemia   . Hypertension   . Migraine     Past Surgical History:  Procedure Laterality Date  . APPENDECTOMY  2005  . CHOLECYSTECTOMY, LAPAROSCOPIC     with Left oophorectomy  . HYSTEROSCOPY  10/26/10   D&C, polyp resection, benign path  . PELVIC LAPAROSCOPY  11/21/08   exp lap with Left salpingo-oophorectomy    Current Outpatient Prescriptions  Medication Sig Dispense Refill  . amLODipine (NORVASC) 2.5 MG tablet Take 1 tablet by mouth daily.  0  . atorvastatin (LIPITOR) 20 MG tablet Take 20 mg by mouth daily.  0  . Multiple Vitamins-Minerals (MULTI FOR HER) PACK Take 1 each by mouth daily.    Marland Kitchen omeprazole (PRILOSEC) 20 MG capsule Take 1 capsule (20 mg total) by mouth 2 (two) times daily before a meal. 28 capsule 0  . traMADol (ULTRAM) 50 MG tablet Take 1 tablet by mouth daily as needed.     . triamterene-hydrochlorothiazide (MAXZIDE-25) 37.5-25 MG per tablet Take 1 tablet by mouth daily.     No current facility-administered medications for this visit.     Family History  Problem Relation Age of Onset  . Breast cancer Mother     after 65  . Esophageal cancer Mother 20  . Osteoporosis Mother   . Hypertension Mother   . Hypertension Father   . Heart attack Father   . Crohn's disease Sister   . Diabetes Sister   . Breast cancer Sister 80    stage III bilateral masterctomy, ? BRCA  . Osteoporosis Maternal Grandmother     ROS:  Pertinent items are noted in HPI.  Otherwise, a comprehensive ROS was negative.  Exam:   BP 120/78 (BP Location: Right Arm, Patient Position: Sitting, Cuff Size: Normal)   Pulse 76   Ht 5' 3"  (1.6 m)   Wt 240 lb (108.9 kg)   LMP 11/26/2015   BMI 42.51 kg/m  Height: 5' 3"  (160 cm) Ht Readings from Last 3 Encounters:  11/10/16 5' 3"  (1.6 m)  02/01/16 5' 4"  (1.626 m)  11/10/15 5' 3.75" (1.619 m)    General appearance: alert, cooperative and appears stated age Head: Normocephalic, without obvious abnormality, atraumatic Neck: no adenopathy, supple, symmetrical, trachea midline and thyroid  normal to inspection and palpation Lungs: clear to auscultation bilaterally Breasts: normal appearance, no masses or tenderness Heart: regular rate and rhythm Abdomen: soft, non-tender; no masses,  no organomegaly Extremities: extremities normal, atraumatic, no cyanosis or edema Skin: Skin color, texture, turgor normal. No rashes or lesions Lymph nodes: Cervical, supraclavicular, and axillary nodes normal. No abnormal inguinal nodes palpated Neurologic: Grossly normal   Pelvic: External genitalia:  no lesions              Urethra:  normal appearing urethra with no masses, tenderness or lesions              Bartholin's and Skene's: normal                 Vagina: normal appearing vagina with normal color and discharge, no lesions              Cervix:  anteverted              Pap taken: No. Bimanual Exam:  Uterus:  normal size, contour, position, consistency, mobility, non-tender              Adnexa: no mass, fullness, tenderness               Rectovaginal: Confirms               Anus:  normal sphincter tone, no lesions  Chaperone present: yes  A:  Well Woman with normal exam  Perimenopausal with current amenorrhea since 11/2015  At risk for endometria hyperplasia History of infertility secondary to endometriosis S/P Exploratory lap W/ LSO secondary to dermoid cyst and endometriosis. 11/2008  Grief reaction to father in law recent death and mother in law last spring death.  [place]: sister with breast cancer age 63 - bilateral mastectomy   P:   Reviewed health and wellness pertinent to exam  Pap smear not done  Mammogram is due 10/2017  Pt will find out from sister about the BRCA test results  She will do another Provera challenge after returns from [place] in a few weeks.  She has RX on hand.  She is aware if no bleeding to call back and we will proceed with [facility].  If bleeding and it is prolonged or heavy to also call back.   Counseled on breast self exam, mammography screening, adequate intake of calcium and vitamin D, diet and exercise, Kegel's exercises return annually or prn  An After Visit Summary was printed and given to the patient.

## 2016-11-10 ENCOUNTER — Ambulatory Visit (INDEPENDENT_AMBULATORY_CARE_PROVIDER_SITE_OTHER): Payer: BC Managed Care – PPO | Admitting: Nurse Practitioner

## 2016-11-10 ENCOUNTER — Encounter: Payer: Self-pay | Admitting: Nurse Practitioner

## 2016-11-10 VITALS — BP 120/78 | HR 76 | Ht 63.0 in | Wt 240.0 lb

## 2016-11-10 DIAGNOSIS — Z803 Family history of malignant neoplasm of breast: Secondary | ICD-10-CM | POA: Diagnosis not present

## 2016-11-10 DIAGNOSIS — F4320 Adjustment disorder, unspecified: Secondary | ICD-10-CM

## 2016-11-10 DIAGNOSIS — Z01411 Encounter for gynecological examination (general) (routine) with abnormal findings: Secondary | ICD-10-CM | POA: Diagnosis not present

## 2016-11-10 DIAGNOSIS — I10 Essential (primary) hypertension: Secondary | ICD-10-CM | POA: Insufficient documentation

## 2016-11-10 DIAGNOSIS — N912 Amenorrhea, unspecified: Secondary | ICD-10-CM | POA: Diagnosis not present

## 2016-11-10 NOTE — Progress Notes (Signed)
Encounter reviewed by Dr. Aundria Rud. I would recommend further evaluation to understand patient's menopausal status and to rule out endometrial hyperplasia.  Patient is morbidly obese and at risk. Will ask patient to return to do [facility], estradiol, pelvic ultrasound with possible endometrial biopsy.

## 2016-11-10 NOTE — Patient Instructions (Addendum)

## 2016-12-07 ENCOUNTER — Telehealth: Payer: Self-pay

## 2016-12-07 DIAGNOSIS — N912 Amenorrhea, unspecified: Secondary | ICD-10-CM

## 2016-12-07 NOTE — Telephone Encounter (Signed)
I have signed the orders for the [facility] and estradiol and closed this encounter.

## 2016-12-07 NOTE — Telephone Encounter (Addendum)
Call to patient for update on status of Provera challenge.

## 2016-12-07 NOTE — Telephone Encounter (Signed)
Spoke with patient. Patient states that she jsut returned from vacation and has not taken Provera yet. Advised patient Dr.Silva reviewed her OV on 11/10/2016 with Kem Boroughs, FNP and recommends further evaluation for menopausal status. Advised it is recommended that she start with lab work to check [facility] and Estradiol level first then additional recommendations will be made. Patient is agreeable. Lab appointment scheduled for 12/13/2016 at 10:30 am. Patient is agreeable to date and time.  Cc: Kem Boroughs, FNP   Routing to Dr.Silva for review.

## 2016-12-13 ENCOUNTER — Other Ambulatory Visit: Payer: BC Managed Care – PPO

## 2016-12-13 DIAGNOSIS — N912 Amenorrhea, unspecified: Secondary | ICD-10-CM

## 2016-12-14 LAB — ESTRADIOL: Estradiol: 24 pg/mL

## 2016-12-14 LAB — FOLLICLE STIMULATING HORMONE: FSH: 33 m[IU]/mL

## 2016-12-15 ENCOUNTER — Telehealth: Payer: Self-pay | Admitting: *Deleted

## 2016-12-15 DIAGNOSIS — N912 Amenorrhea, unspecified: Secondary | ICD-10-CM

## 2016-12-15 DIAGNOSIS — N951 Menopausal and female climacteric states: Secondary | ICD-10-CM

## 2016-12-15 NOTE — Telephone Encounter (Signed)
No answer, unable to leave message, phone rang and then disconnected.

## 2016-12-15 NOTE — Telephone Encounter (Signed)
-----   Message from Nunzio Cobbs, MD sent at 12/14/2016  7:37 PM EDT ----- Please report results to patient.  Labs look like perimenopause but are not convincing for menopause.  I do recommend proceeding with office ultrasound and endometrial biopsy to rule out endometrial hyperplasia.  Please send for precert and schedule with me.   [place]

## 2016-12-20 NOTE — Telephone Encounter (Signed)
Spoke with patient, advised of results and recommendations as seen below per Dr. Quincy Simmonds. Patient verbalizes understanding and is agreeable. Patient states she does not have her calendar with her for scheduling, will return call on 4/10 to schedule.    Cc: Lerry Liner, Kem Boroughs, NP

## 2016-12-20 NOTE — Telephone Encounter (Signed)
Thank you for the update.  Keep in your inbox and check back on 12/21/16 so we can follow through on patient's care.  Cc- Lamont Snowball

## 2016-12-21 NOTE — Telephone Encounter (Signed)
Left message to call Armon Orvis at [phone].  

## 2016-12-28 NOTE — Telephone Encounter (Signed)
Left message to call Dameion Briles at [phone].  

## 2016-12-30 NOTE — Telephone Encounter (Signed)
Patient will need a letter sent recommending a return visit for further discussion and evaluation for amenorrhea.   Cc- Lamont Snowball

## 2016-12-30 NOTE — Telephone Encounter (Signed)
Dr. Quincy Simmonds -have called and left message x2 with no return call, please advise?  Cc: Sara Lyons

## 2017-01-04 NOTE — Telephone Encounter (Signed)
Follow-up call to patient. Left message to call back to sally, nursing supervisor, regarding plans for recommended testing. (per ROI, can leave detailed message and voice mail confirms patients preferred name of "[name]").

## 2017-01-11 ENCOUNTER — Telehealth: Payer: Self-pay | Admitting: *Deleted

## 2017-01-11 NOTE — Telephone Encounter (Signed)
Will forward to Dr Quincy Simmonds.

## 2017-01-11 NOTE — Telephone Encounter (Signed)
Call previously routed to Dr Talbert Nan in error.  Routing to Dr Quincy Simmonds, supervising physician for Edman Circle FNP and has reviewed chart.

## 2017-01-11 NOTE — Telephone Encounter (Signed)
Opened in error. See phone note  From today on phone encounter from 12-15-16.  Routing to provider for final review.  Will close encounter.

## 2017-01-11 NOTE — Telephone Encounter (Signed)
Patient returned call. Very apologetic that she has missed our calls. Discussed recommendation to have consult with Dr Quincy Simmonds to review labs and amenorrhea for possible evaluation of endometrial lining.  Consult with possible endometrial biopsy scheduled for Wednesday 01-19-17 at 330. 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 Needs late afternoon and has few options until school is out. Instructed to take Motrin 800 mg one hour prior with food in preparation for possible biopsy.   Routing to provider for final review. Patient agreeable to disposition. Will close encounter.

## 2017-01-19 ENCOUNTER — Encounter: Payer: Self-pay | Admitting: Obstetrics and Gynecology

## 2017-01-19 ENCOUNTER — Ambulatory Visit (INDEPENDENT_AMBULATORY_CARE_PROVIDER_SITE_OTHER): Payer: BC Managed Care – PPO | Admitting: Obstetrics and Gynecology

## 2017-01-19 VITALS — BP 138/76 | HR 88 | Resp 16 | Ht 63.0 in | Wt 237.0 lb

## 2017-01-19 DIAGNOSIS — N951 Menopausal and female climacteric states: Secondary | ICD-10-CM | POA: Diagnosis not present

## 2017-01-19 DIAGNOSIS — N912 Amenorrhea, unspecified: Secondary | ICD-10-CM

## 2017-01-19 NOTE — Progress Notes (Signed)
GYNECOLOGY  VISIT   HPI: 56 y.o.   Married  Caucasian  female   G0P0000 with No LMP recorded (lmp unknown).   here for to review lab results and to possibly have endometrial biopsy.  LMP 11/16/15.   Labs E2 24 and FSH 33 on 12/13/16.   History of endometriosis.  Long history of irregular menses. Hx of infertility.   GYNECOLOGIC HISTORY: No LMP recorded (lmp unknown). Contraception:  Perimenopausal Menopausal hormone therapy:  none Last mammogram: 10-29-16 Density B/Neg/BiRads1:TBC Last pap smear: 11-04-14 Neg:Neg HR HPV; 10-04-11 Neg:Neg HR HPV        OB History    Gravida Para Term Preterm AB Living   0 0 0 0 0 0   SAB TAB Ectopic Multiple Live Births   0 0 0 0 0         Patient Active Problem List   Diagnosis Date Noted  . Family history of breast cancer in first degree relative 11/10/2016  . Essential hypertension 11/10/2016  . Adult situational stress disorder 11/10/2016  . Amenorrhea 11/10/2016    Past Medical History:  Diagnosis Date  . Hyperlipidemia   . Hypertension   . Migraine     Past Surgical History:  Procedure Laterality Date  . APPENDECTOMY  2005  . CHOLECYSTECTOMY, LAPAROSCOPIC     with Left oophorectomy  . HYSTEROSCOPY  10/26/10   D&C, polyp resection, benign path  . PELVIC LAPAROSCOPY  11/21/08   exp lap with Left salpingo-oophorectomy    Current Outpatient Prescriptions  Medication Sig Dispense Refill  . amLODipine (NORVASC) 2.5 MG tablet Take 1 tablet by mouth daily.  0  . atorvastatin (LIPITOR) 20 MG tablet Take 20 mg by mouth daily.  0  . Multiple Vitamins-Minerals (MULTI FOR HER) PACK Take 1 each by mouth daily.    Marland Kitchen omeprazole (PRILOSEC) 20 MG capsule Take 1 capsule (20 mg total) by mouth 2 (two) times daily before a meal. 28 capsule 0  . triamterene-hydrochlorothiazide (MAXZIDE-25) 37.5-25 MG per tablet Take 1 tablet by mouth daily.     No current facility-administered medications for this visit.      ALLERGIES: Demerol  [meperidine]  Family History  Problem Relation Age of Onset  . Breast cancer Mother     after 16  . Esophageal cancer Mother 61  . Osteoporosis Mother   . Hypertension Mother   . Hypertension Father   . Heart attack Father   . Crohn's disease Sister   . Diabetes Sister   . Breast cancer Sister 34    stage III bilateral masterctomy, ? BRCA  . Osteoporosis Maternal Grandmother     Social History   Social History  . Marital status: Married    Spouse name: N/A  . Number of children: 1  . Years of education: N/A   Occupational History  . Not on file.   Social History Main Topics  . Smoking status: Never Smoker  . Smokeless tobacco: Never Used  . Alcohol use 0.0 oz/week     Comment: less than one  . Drug use: No  . Sexual activity: Not Currently    Partners: Male    Birth control/ protection: None   Other Topics Concern  . Not on file   Social History Narrative   Works as Animal nutritionist    ROS:  Pertinent items are noted in HPI.  PHYSICAL EXAMINATION:    BP 138/76 (BP Location: Left Arm, Patient Position: Sitting, Cuff Size: Large)   Pulse  88   Resp 16   Ht 5' 3"  (1.6 m)   Wt 237 lb (107.5 kg)   LMP  (LMP Unknown)   BMI 41.98 kg/m     General appearance: alert, cooperative and appears stated age    Pelvic: External genitalia:  no lesions              Urethra:  normal appearing urethra with no masses, tenderness or lesions              Bartholins and Skenes: normal                 Vagina: normal appearing vagina with normal color and discharge, no lesions              Cervix: no lesions                Bimanual Exam:  Uterus:  normal size, contour, position, consistency, mobility, non-tender              Adnexa: no mass, fullness, tenderness         Endometrial biopsy. Consent for procedure.  Speculum placed.  Hibiclens prep.  Paracervical block with 10 cc 1% lidocaine, lot [id number], exp 02/21. Pipelle passed x 2.  Tissue to pathology.  No  complications.  Minimal EBL.  Chaperone was present for exam.  ASSESSMENT  Amenorrhea.  Perimenopausal female.  Obesity.   PLAN  Rational for EMB explained to rule out hyperplasia/proliferative change.  Follow up biopsy report.  Instructions and precautions given.  Final plan to follow after report back.   An After Visit Summary was printed and given to the patient.  ___15___ minutes face to face time of which over 50% was spent in counseling.

## 2017-01-19 NOTE — Patient Instructions (Signed)

## 2017-01-24 ENCOUNTER — Telehealth: Payer: Self-pay

## 2017-01-24 NOTE — Telephone Encounter (Addendum)
Spoke with patient. Advised of results and message as seen below from [place]. Patient verbalizes understanding. Patient is only able to schedule in late afternoon due to work. [facility] scheduled for 02/03/2017 at 3 pm with 3:30 pm consult with Dr.Silva. Patient is agreeable to date and time.  ----- Message from Nunzio Cobbs, MD sent at 01/24/2017 10:06 AM EDT ----- Please report EMB results to patient which showed an endometrial polyp. I am recommending further evaluation with a sonohysterogram with me in the office.  I need to understand if this is microscopic or if there is really a mass inside the uterus that needs removal through hysteroscopy/dilation and curettage. Sonohysterogram will give [place] this answer.  Cc- Marisa Sprinkles

## 2017-01-24 NOTE — Telephone Encounter (Signed)
Patient has a question for Dr.Silva's nurse. No details given.

## 2017-01-24 NOTE — Telephone Encounter (Signed)
Returned call to patient. Patient is asking if EMB showed any abnormal cells. Advised EMB showed no atypia or malignancy. Patient is agreeable and verbalizes understanding.  Routing to provider for final review. Patient agreeable to disposition. Will close encounter.

## 2017-02-02 ENCOUNTER — Other Ambulatory Visit: Payer: Self-pay | Admitting: *Deleted

## 2017-02-02 DIAGNOSIS — N951 Menopausal and female climacteric states: Secondary | ICD-10-CM

## 2017-02-02 DIAGNOSIS — N840 Polyp of corpus uteri: Secondary | ICD-10-CM

## 2017-02-02 DIAGNOSIS — N912 Amenorrhea, unspecified: Secondary | ICD-10-CM

## 2017-02-03 ENCOUNTER — Ambulatory Visit (INDEPENDENT_AMBULATORY_CARE_PROVIDER_SITE_OTHER): Payer: BC Managed Care – PPO | Admitting: Obstetrics and Gynecology

## 2017-02-03 ENCOUNTER — Ambulatory Visit (INDEPENDENT_AMBULATORY_CARE_PROVIDER_SITE_OTHER): Payer: BC Managed Care – PPO

## 2017-02-03 ENCOUNTER — Other Ambulatory Visit: Payer: Self-pay | Admitting: Obstetrics and Gynecology

## 2017-02-03 ENCOUNTER — Encounter: Payer: Self-pay | Admitting: Obstetrics and Gynecology

## 2017-02-03 VITALS — BP 138/72 | HR 96 | Resp 16 | Wt 237.0 lb

## 2017-02-03 DIAGNOSIS — N840 Polyp of corpus uteri: Secondary | ICD-10-CM

## 2017-02-03 DIAGNOSIS — N951 Menopausal and female climacteric states: Secondary | ICD-10-CM | POA: Diagnosis not present

## 2017-02-03 DIAGNOSIS — D259 Leiomyoma of uterus, unspecified: Secondary | ICD-10-CM | POA: Diagnosis not present

## 2017-02-03 DIAGNOSIS — N912 Amenorrhea, unspecified: Secondary | ICD-10-CM

## 2017-02-03 NOTE — Progress Notes (Signed)
Encounter reviewed by Dr. Mollee Neer Amundson C. Silva.  

## 2017-02-03 NOTE — Progress Notes (Signed)
GYNECOLOGY  VISIT   HPI: 56 y.o.   Married  Caucasian  female   G0P0000 with No LMP recorded (lmp unknown). Patient is not currently having periods (Reason: Perimenopausal).   here for a Sonohysterogram due to amenorrhea, perimemopausal hormonal levels, and bningpolyp noted on EMB performed on 01/19/17.  LMP October 2017 per patient.  States it was painful. Labs E2 24 and FSH 33 on 12/13/16.  History of endometriosis.  Long history of irregular menses. Hx of infertility. Hx LSO for left ovarian cyst.  Hx hysteroscopic polypectomy.   Asking about hysterectomy.  GYNECOLOGIC HISTORY: No LMP recorded (lmp unknown). Patient is not currently having periods (Reason: Perimenopausal). Contraception:  Perimenopausal/abstinence Menopausal hormone therapy:  n/a Last mammogram:   10-29-16 Density B/Neg/BiRads1:TBC Last pap smear:    11-04-14 Neg:Neg HR HPV; 10-04-11 Neg:Neg HR HPV        OB History    Gravida Para Term Preterm AB Living   0 0 0 0 0 0   SAB TAB Ectopic Multiple Live Births   0 0 0 0 0         Patient Active Problem List   Diagnosis Date Noted  . Family history of breast cancer in first degree relative 11/10/2016  . Essential hypertension 11/10/2016  . Adult situational stress disorder 11/10/2016  . Amenorrhea 11/10/2016    Past Medical History:  Diagnosis Date  . Hyperlipidemia   . Hypertension   . Migraine     Past Surgical History:  Procedure Laterality Date  . APPENDECTOMY  2005  . CHOLECYSTECTOMY, LAPAROSCOPIC     with Left oophorectomy  . HYSTEROSCOPY  10/26/10   D&C, polyp resection, benign path  . PELVIC LAPAROSCOPY  11/21/08   exp lap with Left salpingo-oophorectomy    Current Outpatient Prescriptions  Medication Sig Dispense Refill  . amLODipine (NORVASC) 2.5 MG tablet Take 1 tablet by mouth daily.  0  . atorvastatin (LIPITOR) 20 MG tablet Take 20 mg by mouth daily.  0  . Multiple Vitamins-Minerals (MULTI FOR HER) PACK Take 1 each by mouth daily.    Marland Kitchen  omeprazole (PRILOSEC) 20 MG capsule Take 1 capsule (20 mg total) by mouth 2 (two) times daily before a meal. 28 capsule 0  . triamterene-hydrochlorothiazide (MAXZIDE-25) 37.5-25 MG per tablet Take 1 tablet by mouth daily.     No current facility-administered medications for this visit.      ALLERGIES: Demerol [meperidine]  Family History  Problem Relation Age of Onset  . Breast cancer Mother        after 70  . Esophageal cancer Mother 30  . Osteoporosis Mother   . Hypertension Mother   . Hypertension Father   . Heart attack Father   . Crohn's disease Sister   . Diabetes Sister   . Breast cancer Sister 65       stage III bilateral masterctomy, ? BRCA  . Osteoporosis Maternal Grandmother     Social History   Social History  . Marital status: Married    Spouse name: N/A  . Number of children: 1  . Years of education: N/A   Occupational History  . Not on file.   Social History Main Topics  . Smoking status: Never Smoker  . Smokeless tobacco: Never Used  . Alcohol use 0.0 oz/week     Comment: less than one  . Drug use: No  . Sexual activity: Not Currently    Partners: Male    Birth control/ protection: None  Other Topics Concern  . Not on file   Social History Narrative   Works as Animal nutritionist    ROS:  Pertinent items are noted in HPI.  PHYSICAL EXAMINATION:    BP 138/72 (BP Location: Right Arm, Patient Position: Sitting, Cuff Size: Large)   Pulse 96   Resp 16   Wt 237 lb (107.5 kg)   LMP  (LMP Unknown)   BMI 41.98 kg/m     General appearance: alert, cooperative and appears stated age  Pelvic ultrasound: Uterus with 2 fibroids:  2.65 cm - possible peduculated and 1.35 cm. EMS 6.46 mm.  Right adnexa normal. Left ovary - 1.9 X 1.6 x 1.0 cm, adherent. Solid avascular mass 10 x 7 x 8 mm medial to the left ovary and inferior to cervix.  Endometriosis versus other etiology. No free fluid.  Sonohysterogram: Consent for procedure.  Hibiclens prep.   Cannula placed and NS injected.  Filling defect 13 x 7 mm noted at fundus.  Chaperone was present for exam.  ASSESSMENT  Perimenopausal female. Fibroids.  Endometrial polyp on recent biopsy. Solid avascular mass near left ovary/cervix.  Possible endometrioma, fibroid. Status post LSO? FH of breast cancer.  Negative BRCA testing in sister.  PLAN  Discussion of fibroids, polyp, and endometriosis.  Reviewed hyseroscopy with dilation and curettage vs. TLH.   Discussion of hysteroscopy with Myosure polypectomy, dilation and curettage.  Risks, benefits, and alternatives reviewed. Risks include but are not limited to bleeding, infection, damage to surrounding organs including uterine perforation requiring hospitalization and laparoscopy, pulmonary edema, reaction to anesthesia, DVT, PE, death, need for further treatment and surgery including hysterectomy or medical therapy.   Surgical expectations and recovery discussed.  Patient wishes to proceed.  Patient will need a follow up pelvic ultrasound to recheck the left adnexal region.  An After Visit Summary was printed and given to the patient.  __25____ minutes face to face time of which over 50% was spent in counseling.

## 2017-02-03 NOTE — Patient Instructions (Signed)
Hysteroscopy Hysteroscopy is a procedure used for looking inside the womb (uterus). It may be done for various reasons, including:  To evaluate abnormal bleeding, fibroid (benign, noncancerous) tumors, polyps, scar tissue (adhesions), and possibly cancer of the uterus.  To look for lumps (tumors) and other uterine growths.  To look for causes of why a woman cannot get pregnant (infertility), causes of recurrent loss of pregnancy (miscarriages), or a lost intrauterine device (IUD).  To perform a sterilization by blocking the fallopian tubes from inside the uterus. In this procedure, a thin, flexible tube with a tiny light and camera on the end of it (hysteroscope) is used to look inside the uterus. A hysteroscopy should be done right after a menstrual period to be sure you are not pregnant. LET [facility] CARE PROVIDER KNOW ABOUT:  Any allergies you have.  All medicines you are taking, including vitamins, herbs, eye drops, creams, and over-the-counter medicines.  Previous problems you or members of your family have had with the use of anesthetics.  Any blood disorders you have.  Previous surgeries you have had.  Medical conditions you have. RISKS AND COMPLICATIONS Generally, this is a safe procedure. However, as with any procedure, complications can occur. Possible complications include:  Putting a hole in the uterus.  Excessive bleeding.  Infection.  Damage to the cervix.  Injury to other organs.  Allergic reaction to medicines.  Too much fluid used in the uterus for the procedure. BEFORE THE PROCEDURE  Ask your health care provider about changing or stopping any regular medicines.  Do not take aspirin or blood thinners for 1 week before the procedure, or as directed by your health care provider. These can cause bleeding.  If you smoke, do not smoke for 2 weeks before the procedure.  In some cases, a medicine is placed in the cervix the day before the procedure. This  medicine makes the cervix have a larger opening (dilate). This makes it easier for the instrument to be inserted into the uterus during the procedure.  Do not eat or drink anything for at least 8 hours before the surgery.  Arrange for someone to take you home after the procedure. PROCEDURE  You may be given a medicine to relax you (sedative). You may also be given one of the following:  A medicine that numbs the area around the cervix (local anesthetic).  A medicine that makes you sleep through the procedure (general anesthetic).  The hysteroscope is inserted through the vagina into the uterus. The camera on the hysteroscope sends a picture to a TV screen. This gives the surgeon a good view inside the uterus.  During the procedure, air or a liquid is put into the uterus, which allows the surgeon to see better.  Sometimes, tissue is gently scraped from inside the uterus. These tissue samples are sent to a lab for testing. What to expect after the procedure  If you had a general anesthetic, you may be groggy for a couple hours after the procedure.  If you had a local anesthetic, you will be able to go home as soon as you are stable and feel ready.  You may have some cramping. This normally lasts for a couple days.  You may have bleeding, which varies from light spotting for a few days to menstrual-like bleeding for 3-7 days. This is normal.  If your test results are not back during the visit, make an appointment with your health care provider to find out the results. This  information is not intended to replace advice given to you by your health care provider. Make sure you discuss any questions you have with your health care provider. Document Released: 12/06/2000 Document Revised: 02/05/2016 Document Reviewed: 03/29/2013 Elsevier Interactive Patient Education  2017 [place].   Dilation and Curettage or Vacuum Curettage Dilation and curettage (D&C) and vacuum curettage are minor  procedures. A D&C involves stretching (dilation) the cervix and scraping (curettage) the inside lining of the uterus (endometrium). During a D&C, tissue is gently scraped from the endometrium, starting from the top portion of the uterus down to the lowest part of the uterus (cervix). During a vacuum curettage, the lining and tissue in the uterus are removed with the use of gentle suction. Curettage may be performed to either diagnose or treat a problem. As a diagnostic procedure, curettage is performed to examine tissues from the uterus. A diagnostic curettage may be done if you have: Irregular bleeding in the uterus. Bleeding with the development of clots. Spotting between menstrual periods. Prolonged menstrual periods or other abnormal bleeding. Bleeding after menopause. No menstrual period (amenorrhea). A change in size and shape of the uterus. Abnormal endometrial cells discovered during a Pap test. As a treatment procedure, curettage may be performed for the following reasons: Removal of an IUD (intrauterine device). Removal of retained placenta after giving birth. Abortion. Miscarriage. Removal of endometrial polyps. Removal of uncommon types of noncancerous lumps (fibroids). Tell a health care provider about: Any allergies you have, including allergies to prescribed medicine or latex. All medicines you are taking, including vitamins, herbs, eye drops, creams, and over-the-counter medicines. This is especially important if you take any blood-thinning medicine. Bring a list of all of your medicines to your appointment. Any problems you or family members have had with anesthetic medicines. Any blood disorders you have. Any surgeries you have had. Your medical history and any medical conditions you have. Whether you are pregnant or may be pregnant. Recent vaginal infections you have had. Recent menstrual periods, bleeding problems you have had, and what form of birth control  (contraception) you use. What are the risks? Generally, this is a safe procedure. However, problems may occur, including: Infection. Heavy vaginal bleeding. Allergic reactions to medicines. Damage to the cervix or other structures or organs. Development of scar tissue (adhesions) inside the uterus, which can cause abnormal amounts of menstrual bleeding. This may make it harder to get pregnant in the future. A hole (perforation) or puncture in the uterine wall. This is rare. What happens before the procedure? Staying hydrated  Follow instructions from your health care provider about hydration, which may include: Up to 2 hours before the procedure - you may continue to drink clear liquids, such as water, clear fruit juice, black coffee, and plain tea. Eating and drinking restrictions  Follow instructions from your health care provider about eating and drinking, which may include: 8 hours before the procedure - stop eating heavy meals or foods such as meat, fried foods, or fatty foods. 6 hours before the procedure - stop eating light meals or foods, such as toast or cereal. 6 hours before the procedure - stop drinking milk or drinks that contain milk. 2 hours before the procedure - stop drinking clear liquids. If your health care provider told you to take your medicine(s) on the day of your procedure, take them with only a sip of water. Medicines  Ask your health care provider about: Changing or stopping your regular medicines. This is especially important if  you are taking diabetes medicines or blood thinners. Taking medicines such as aspirin and ibuprofen. These medicines can thin your blood. Do not take these medicines before your procedure if your health care provider instructs you not to. You may be given antibiotic medicine to help prevent infection. General instructions  For 24 hours before your procedure, do not: Douche. Use tampons. Use medicines, creams, or suppositories in the  vagina. Have sexual intercourse. You may be given a pregnancy test on the day of the procedure. Plan to have someone take you home from the hospital or clinic. You may have a blood or urine sample taken. If you will be going home right after the procedure, plan to have someone with you for 24 hours. What happens during the procedure? To reduce your risk of infection: Your health care team will wash or sanitize their hands. Your skin will be washed with soap. An IV tube will be inserted into one of your veins. You will be given one of the following: A medicine that numbs the area in and around the cervix (local anesthetic). A medicine to make you fall asleep (general anesthetic). You will lie down on your back, with your feet in foot rests (stirrups). The size and position of your uterus will be checked. A lubricated instrument (speculum or Sims retractor) will be inserted into the back side of your vagina. The speculum will be used to hold apart the walls of your vagina so your health care provider can see your cervix. A tool (tenaculum) will be attached to the lip of the cervix to stabilize it. Your cervix will be softened and dilated. This may be done by: Taking a medicine. Having tapered dilators or thin rods (laminaria) or gradual widening instruments (tapered dilators) inserted into your cervix. A small, sharp, curved instrument (curette) will be used to scrape a small amount of tissue or cells from the endometrium or cervical canal. In some cases, gentle suction is applied with the curette. The curette will then be removed. The cells will be taken to a lab for testing. The procedure may vary among health care providers and hospitals. What happens after the procedure? You may have mild cramping, backache, pain, and light bleeding or spotting. You may pass small blood clots from your vagina. You may have to wear compression stockings. These stockings help to prevent blood clots and  reduce swelling in your legs. Your blood pressure, heart rate, breathing rate, and blood oxygen level will be monitored until the medicines you were given have worn off. Summary Dilation and curettage (D&C) involves stretching (dilation) the cervix and scraping (curettage) the inside lining of the uterus (endometrium). After the procedure, you may have mild cramping, backache, pain, and light bleeding or spotting. You may pass small blood clots from your vagina. Plan to have someone take you home from the hospital or clinic. This information is not intended to replace advice given to you by your health care provider. Make sure you discuss any questions you have with your health care provider. Document Released: 08/30/2005 Document Revised: 05/16/2016 Document Reviewed: 05/16/2016 Elsevier Interactive Patient Education  2017 [employer].

## 2017-02-28 ENCOUNTER — Telehealth: Payer: Self-pay | Admitting: Obstetrics and Gynecology

## 2017-02-28 NOTE — Telephone Encounter (Signed)
Routing to [employer].  Cc: Dr. Quincy Simmonds, Lerry Liner

## 2017-02-28 NOTE — Telephone Encounter (Signed)
Patient said "I am still waiting to hear about scheduling a D&C procedure and I have been waiting for three weeks". Patient is asking if someone would please call her schedule this procedure ASAP. Patient would like to schedule a vacation and cannot until this is scheduled.

## 2017-02-28 NOTE — Telephone Encounter (Signed)
Return call to patient. Apologized to patient for delay in scheduling.  Surgery dates discussed and patient selected 03-15-17. Surgery instructions reviewed and printed copy will be provided at consult appointment on 03-03-17.  Routing to provider for final review. Patient agreeable to disposition. Will close encounter.

## 2017-03-03 ENCOUNTER — Encounter: Payer: Self-pay | Admitting: Obstetrics and Gynecology

## 2017-03-03 ENCOUNTER — Ambulatory Visit (INDEPENDENT_AMBULATORY_CARE_PROVIDER_SITE_OTHER): Payer: BC Managed Care – PPO | Admitting: Obstetrics and Gynecology

## 2017-03-03 VITALS — BP 110/76 | HR 72 | Resp 16 | Ht 63.0 in | Wt 248.0 lb

## 2017-03-03 DIAGNOSIS — Z01812 Encounter for preprocedural laboratory examination: Secondary | ICD-10-CM

## 2017-03-03 DIAGNOSIS — R1900 Intra-abdominal and pelvic swelling, mass and lump, unspecified site: Secondary | ICD-10-CM | POA: Diagnosis not present

## 2017-03-03 DIAGNOSIS — L816 Other disorders of diminished melanin formation: Secondary | ICD-10-CM

## 2017-03-03 DIAGNOSIS — N840 Polyp of corpus uteri: Secondary | ICD-10-CM | POA: Diagnosis not present

## 2017-03-03 DIAGNOSIS — L819 Disorder of pigmentation, unspecified: Secondary | ICD-10-CM | POA: Diagnosis not present

## 2017-03-03 NOTE — Progress Notes (Signed)
GYNECOLOGY  VISIT   HPI: 56 y.o.   Married  Caucasian  female   G0P0000 with Patient's last menstrual period was 06/13/2016 (approximate).   here for  Pre-op consult    Seen for amenorrhea and perimenopausal hormonal levels. EMB showed benign polyp. Follow up ultrasound showed fibroid including 32. 21 mm ? Pedunculated fibroid.   Right adnexa absent.   Left ovary 1.9 x 1.6 x 1.0 cm.  Medial to left ovary and infection to cervix is a solid mass 10 x 7 x 8 mm which is avascular consistent with possible endometriosis. Sonohysterogram showed filling defect 13 X 7 mm.   She states a hx of advanced endometriosis and very painful menses while she was having them.   Vulvar itching for 3 months.  No FH reaction to anesthesia.  GYNECOLOGIC HISTORY: Patient's last menstrual period was 06/13/2016 (approximate). Contraception:  Perimenopausal/abstinence Menopausal hormone therapy:  n/a Last mammogram:  10-29-16 Density B/Neg/BiRads1:TBC Last pap smear:    11-04-14 Neg:Neg HR HPV; 10-04-11 Neg:Neg HR HPV        OB History    Gravida Para Term Preterm AB Living   0 0 0 0 0 0   SAB TAB Ectopic Multiple Live Births   0 0 0 0 0         Patient Active Problem List   Diagnosis Date Noted  . Family history of breast cancer in first degree relative 11/10/2016  . Essential hypertension 11/10/2016  . Adult situational stress disorder 11/10/2016  . Amenorrhea 11/10/2016    Past Medical History:  Diagnosis Date  . Hyperlipidemia   . Hypertension   . Migraine    no aura    Past Surgical History:  Procedure Laterality Date  . APPENDECTOMY  2005  . CHOLECYSTECTOMY, LAPAROSCOPIC     with Left oophorectomy  . HYSTEROSCOPY  10/26/10   D&C, polyp resection, benign path  . PELVIC LAPAROSCOPY  11/21/08   exp lap with Left salpingo-oophorectomy    Current Outpatient Prescriptions  Medication Sig Dispense Refill  . amLODipine (NORVASC) 2.5 MG tablet Take 1 tablet by mouth daily.  0  .  atorvastatin (LIPITOR) 20 MG tablet Take 20 mg by mouth daily.  0  . IBUPROFEN PO Take by mouth as needed.    . Ibuprofen-Diphenhydramine Cit (ADVIL PM PO) Take by mouth as needed.    . Multiple Vitamins-Minerals (MULTI FOR HER) PACK Take 1 each by mouth daily.    Marland Kitchen omeprazole (PRILOSEC) 20 MG capsule Take 1 capsule (20 mg total) by mouth 2 (two) times daily before a meal. 28 capsule 0  . triamterene-hydrochlorothiazide (MAXZIDE-25) 37.5-25 MG per tablet Take 1 tablet by mouth daily.     No current facility-administered medications for this visit.      ALLERGIES: Demerol [meperidine] and Pravastatin  Family History  Problem Relation Age of Onset  . Breast cancer Mother        after 8  . Esophageal cancer Mother 35  . Osteoporosis Mother   . Hypertension Mother   . Hypertension Father   . Heart attack Father   . Crohn's disease Sister   . Diabetes Sister   . Breast cancer Sister 51       stage III bilateral masterctomy, ? BRCA  . Osteoporosis Maternal Grandmother     Social History   Social History  . Marital status: Married    Spouse name: N/A  . Number of children: 1  . Years of education: N/A  Occupational History  . Not on file.   Social History Main Topics  . Smoking status: Never Smoker  . Smokeless tobacco: Never Used  . Alcohol use No  . Drug use: No  . Sexual activity: Not Currently    Partners: Male    Birth control/ protection: None   Other Topics Concern  . Not on file   Social History Narrative   Works as Animal nutritionist    ROS:  Pertinent items are noted in HPI.  PHYSICAL EXAMINATION:    BP 110/76   Pulse 72   Resp 16   Ht _0  (1.6 m)   Wt 248 lb (112.5 kg)   LMP 06/13/2016 (Approximate)   BMI 43.93 kg/m     General appearance: alert, cooperative and appears stated age Head: Normocephalic, without obvious abnormality, atraumatic Neck: no adenopathy, supple, symmetrical, trachea midline and thyroid normal to inspection and  palpation Lungs: clear to auscultation bilaterally Breasts: normal appearance, no masses or tenderness, No nipple retraction or dimpling, No nipple discharge or bleeding, No axillary or supraclavicular adenopathy Heart: regular rate and rhythm Abdomen: soft, non-tender, no masses,  no organomegaly Extremities: extremities normal, atraumatic, no cyanosis or edema Skin: Skin color, texture, turgor normal. No rashes or lesions Lymph nodes: Cervical, supraclavicular, and axillary nodes normal. No abnormal inguinal nodes palpated Neurologic: Grossly normal  Pelvic: External genitalia:   Hypopigmented vulva.  Some fissure formation.                Urethra:  normal appearing urethra with no masses, tenderness or lesions              Bartholins and Skenes: normal                 Vagina: normal appearing vagina with normal color and discharge, no lesions              Cervix: no lesions                Bimanual Exam:  Uterus:  normal size, contour, position, consistency, mobility, non-tender              Adnexa: no mass, fullness, tenderness            Chaperone was present for exam.  ASSESSMENT  Endometrial polyp.  Fibroids.  Status post USO.  Left adnexal mass, possible endometrioma.  Vulvar hypopigmentation and fissures consistent with possible lichen sclerosus.   PLAN  Plan for CMP, CBC, CEA, CA125.  Proceed with hysteroscopic resection of endometrial mass, dilation and curettage. Risks, benefits, and alternatives discussed with the patient who wishes to proceed. Will add vulvar biopsy to surgical procedure.  Lichen sclerosus discussed and anticipated tx with steroids.   An After Visit Summary was printed and given to the patient.  __25____ minutes face to face time of which over 50% was spent in counseling.

## 2017-03-04 LAB — CBC
HEMATOCRIT: 40 % (ref 34.0–46.6)
HEMOGLOBIN: 13 g/dL (ref 11.1–15.9)
MCH: 27.3 pg (ref 26.6–33.0)
MCHC: 32.5 g/dL (ref 31.5–35.7)
MCV: 84 fL (ref 79–97)
Platelets: 300 10*3/uL (ref 150–379)
RBC: 4.76 x10E6/uL (ref 3.77–5.28)
RDW: 15.3 % (ref 12.3–15.4)
WBC: 10.4 10*3/uL (ref 3.4–10.8)

## 2017-03-04 LAB — COMPREHENSIVE METABOLIC PANEL
A/G RATIO: 1.8 (ref 1.2–2.2)
ALBUMIN: 4.6 g/dL (ref 3.5–5.5)
ALK PHOS: 109 IU/L (ref 39–117)
ALT: 24 IU/L (ref 0–32)
AST: 26 IU/L (ref 0–40)
BUN / CREAT RATIO: 24 — AB (ref 9–23)
BUN: 15 mg/dL (ref 6–24)
Bilirubin Total: <0.2 mg/dL (ref 0.0–1.2)
CO2: 21 mmol/L (ref 20–29)
CREATININE: 0.63 mg/dL (ref 0.57–1.00)
Calcium: 10 mg/dL (ref 8.7–10.2)
Chloride: 100 mmol/L (ref 96–106)
GFR calc Af Amer: 114 mL/min/{1.73_m2} (ref >59)
GFR, EST NON AFRICAN AMERICAN: 99 mL/min/{1.73_m2} (ref >59)
GLOBULIN, TOTAL: 2.5 g/dL (ref 1.5–4.5)
Glucose: 96 mg/dL (ref 65–99)
Potassium: 4.4 mmol/L (ref 3.5–5.2)
SODIUM: 141 mmol/L (ref 134–144)
Total Protein: 7.1 g/dL (ref 6.0–8.5)

## 2017-03-04 LAB — CA 125: CA 125: 18.5 U/mL (ref 0.0–38.1)

## 2017-03-04 LAB — CEA: CEA: 1.3 ng/mL (ref 0.0–4.7)

## 2017-03-04 LAB — POCT URINE PREGNANCY: Preg Test, Ur: NEGATIVE

## 2017-03-05 NOTE — Addendum Note (Signed)
Addended by: Yisroel Ramming, Dietrich Pates E on: 03/05/2017 10:11 AM   Modules accepted: Orders

## 2017-03-07 ENCOUNTER — Other Ambulatory Visit: Payer: Self-pay | Admitting: *Deleted

## 2017-03-07 DIAGNOSIS — N9489 Other specified conditions associated with female genital organs and menstrual cycle: Secondary | ICD-10-CM

## 2017-03-08 ENCOUNTER — Encounter (HOSPITAL_BASED_OUTPATIENT_CLINIC_OR_DEPARTMENT_OTHER): Payer: Self-pay | Admitting: *Deleted

## 2017-03-08 NOTE — Progress Notes (Signed)
NPO AFTER MN.  ARRIVE AT 0930.  NEEDS EKG.  CURRENT LAB RESULTS IN CHART AND EPIC (INCLUDING NEG. PREG. TEST).  WILL TAKE NORVASC, LIPITOR, AND PRILOSEC AM DOS W/ SIPS OF WATER.

## 2017-03-15 ENCOUNTER — Ambulatory Visit (HOSPITAL_BASED_OUTPATIENT_CLINIC_OR_DEPARTMENT_OTHER): Payer: BC Managed Care – PPO | Admitting: Anesthesiology

## 2017-03-15 ENCOUNTER — Other Ambulatory Visit: Payer: Self-pay

## 2017-03-15 ENCOUNTER — Ambulatory Visit (HOSPITAL_BASED_OUTPATIENT_CLINIC_OR_DEPARTMENT_OTHER)
Admission: RE | Admit: 2017-03-15 | Discharge: 2017-03-15 | Disposition: A | Discharge status: Home / Self Care | Payer: BC Managed Care – PPO | Source: Ambulatory Visit | Attending: Obstetrics and Gynecology | Admitting: Obstetrics and Gynecology

## 2017-03-15 ENCOUNTER — Encounter (HOSPITAL_BASED_OUTPATIENT_CLINIC_OR_DEPARTMENT_OTHER): Payer: Self-pay | Admitting: *Deleted

## 2017-03-15 ENCOUNTER — Encounter (HOSPITAL_BASED_OUTPATIENT_CLINIC_OR_DEPARTMENT_OTHER)
Admission: RE | Disposition: A | Discharge status: Home / Self Care | Payer: Self-pay | Source: Ambulatory Visit | Attending: Obstetrics and Gynecology

## 2017-03-15 DIAGNOSIS — I10 Essential (primary) hypertension: Secondary | ICD-10-CM | POA: Diagnosis not present

## 2017-03-15 DIAGNOSIS — E785 Hyperlipidemia, unspecified: Secondary | ICD-10-CM | POA: Diagnosis not present

## 2017-03-15 DIAGNOSIS — Z79899 Other long term (current) drug therapy: Secondary | ICD-10-CM | POA: Diagnosis not present

## 2017-03-15 DIAGNOSIS — L816 Other disorders of diminished melanin formation: Secondary | ICD-10-CM | POA: Diagnosis not present

## 2017-03-15 DIAGNOSIS — N904 Leukoplakia of vulva: Secondary | ICD-10-CM | POA: Diagnosis not present

## 2017-03-15 DIAGNOSIS — N912 Amenorrhea, unspecified: Secondary | ICD-10-CM | POA: Diagnosis not present

## 2017-03-15 DIAGNOSIS — L819 Disorder of pigmentation, unspecified: Secondary | ICD-10-CM | POA: Diagnosis not present

## 2017-03-15 DIAGNOSIS — N840 Polyp of corpus uteri: Secondary | ICD-10-CM | POA: Diagnosis not present

## 2017-03-15 DIAGNOSIS — N9089 Other specified noninflammatory disorders of vulva and perineum: Secondary | ICD-10-CM | POA: Diagnosis not present

## 2017-03-15 HISTORY — DX: Gastro-esophageal reflux disease without esophagitis: K21.9

## 2017-03-15 HISTORY — DX: Other specified postprocedural states: R11.2

## 2017-03-15 HISTORY — DX: Personal history of other diseases of the female genital tract: Z87.42

## 2017-03-15 HISTORY — DX: Personal history of other diseases of the nervous system and sense organs: Z86.69

## 2017-03-15 HISTORY — DX: Other specified postprocedural states: Z98.890

## 2017-03-15 HISTORY — DX: Other specified cataract: H26.8

## 2017-03-15 HISTORY — DX: Polyp of corpus uteri: N84.0

## 2017-03-15 HISTORY — DX: Leiomyoma of uterus, unspecified: D25.9

## 2017-03-15 HISTORY — DX: Unspecified osteoarthritis, unspecified site: M19.90

## 2017-03-15 HISTORY — PX: DILATATION & CURETTAGE/HYSTEROSCOPY WITH MYOSURE: SHX6511

## 2017-03-15 HISTORY — DX: Menopausal and female climacteric states: N95.1

## 2017-03-15 SURGERY — DILATATION & CURETTAGE/HYSTEROSCOPY WITH MYOSURE
Anesthesia: General | Site: Vagina

## 2017-03-15 MED ORDER — DEXAMETHASONE SODIUM PHOSPHATE 4 MG/ML IJ SOLN
INTRAMUSCULAR | Status: DC | PRN
  Administered 2017-03-15: 10 mg via INTRAVENOUS

## 2017-03-15 MED ORDER — MIDAZOLAM HCL 5 MG/5ML IJ SOLN
INTRAMUSCULAR | Status: DC | PRN
  Administered 2017-03-15: 2 mg via INTRAVENOUS

## 2017-03-15 MED ORDER — LIDOCAINE 2% (20 MG/ML) 5 ML SYRINGE
INTRAMUSCULAR | Status: DC | PRN
  Administered 2017-03-15: 100 mg via INTRAVENOUS

## 2017-03-15 MED ORDER — ONDANSETRON HCL 4 MG/2ML IJ SOLN
INTRAMUSCULAR | Status: AC
  Filled 2017-03-15: qty 2

## 2017-03-15 MED ORDER — OXYCODONE HCL 5 MG/5ML PO SOLN
5.0000 mg | Freq: Once | ORAL | Status: AC | PRN
  Filled 2017-03-15: qty 5

## 2017-03-15 MED ORDER — ONDANSETRON HCL 4 MG/2ML IJ SOLN
INTRAMUSCULAR | Status: DC | PRN
  Administered 2017-03-15: 4 mg via INTRAVENOUS

## 2017-03-15 MED ORDER — FENTANYL CITRATE (PF) 100 MCG/2ML IJ SOLN
INTRAMUSCULAR | Status: DC | PRN
  Administered 2017-03-15 (×2): 50 ug via INTRAVENOUS

## 2017-03-15 MED ORDER — PROMETHAZINE HCL 25 MG/ML IJ SOLN
6.2500 mg | INTRAMUSCULAR | Status: DC | PRN
  Filled 2017-03-15: qty 1

## 2017-03-15 MED ORDER — PROPOFOL 10 MG/ML IV BOLUS
INTRAVENOUS | Status: AC
  Filled 2017-03-15: qty 20

## 2017-03-15 MED ORDER — OXYCODONE HCL 5 MG PO TABS
5.0000 mg | ORAL_TABLET | Freq: Once | ORAL | Status: AC | PRN
  Administered 2017-03-15: 5 mg via ORAL
  Filled 2017-03-15: qty 1

## 2017-03-15 MED ORDER — IBUPROFEN 800 MG PO TABS: 800.0000 mg | ORAL_TABLET | Freq: Three times a day (TID) | ORAL | 0 refills | Status: AC | PRN

## 2017-03-15 MED ORDER — LIDOCAINE 2% (20 MG/ML) 5 ML SYRINGE
INTRAMUSCULAR | Status: AC
  Filled 2017-03-15: qty 5

## 2017-03-15 MED ORDER — PROPOFOL 10 MG/ML IV BOLUS
INTRAVENOUS | Status: DC | PRN
  Administered 2017-03-15: 200 mg via INTRAVENOUS
  Administered 2017-03-15: 70 mg via INTRAVENOUS

## 2017-03-15 MED ORDER — DEXAMETHASONE SODIUM PHOSPHATE 10 MG/ML IJ SOLN
INTRAMUSCULAR | Status: AC
  Filled 2017-03-15: qty 1

## 2017-03-15 MED ORDER — MIDAZOLAM HCL 2 MG/2ML IJ SOLN
INTRAMUSCULAR | Status: AC
Start: 2017-03-15 — End: 2017-03-15
  Filled 2017-03-15: qty 2

## 2017-03-15 MED ORDER — LACTATED RINGERS IV SOLN
INTRAVENOUS | Status: DC
  Filled 2017-03-15: qty 1000

## 2017-03-15 MED ORDER — HYDROMORPHONE HCL 1 MG/ML IJ SOLN
0.2500 mg | INTRAMUSCULAR | Status: DC | PRN
  Filled 2017-03-15: qty 0.5

## 2017-03-15 MED ORDER — OXYCODONE HCL 5 MG PO TABS
ORAL_TABLET | ORAL | Status: AC
  Filled 2017-03-15: qty 1

## 2017-03-15 MED ORDER — FENTANYL CITRATE (PF) 100 MCG/2ML IJ SOLN
INTRAMUSCULAR | Status: AC
  Filled 2017-03-15: qty 2

## 2017-03-15 MED ORDER — LIDOCAINE HCL 1 % IJ SOLN
INTRAMUSCULAR | Status: DC | PRN
  Administered 2017-03-15: 12 mL

## 2017-03-15 MED ORDER — KETOROLAC TROMETHAMINE 30 MG/ML IJ SOLN
INTRAMUSCULAR | Status: DC | PRN
  Administered 2017-03-15: 30 mg via INTRAVENOUS

## 2017-03-15 MED ORDER — LACTATED RINGERS IV SOLN
INTRAVENOUS | Status: DC
  Administered 2017-03-15: 10:00:00 via INTRAVENOUS
  Filled 2017-03-15: qty 1000

## 2017-03-15 MED ORDER — SODIUM CHLORIDE 0.9 % IR SOLN
Status: DC | PRN
  Administered 2017-03-15: 3000 mL

## 2017-03-15 SURGICAL SUPPLY — 29 items
CANISTER SUCT 3000ML PPV (MISCELLANEOUS) ×2 IMPLANT
CATH ROBINSON RED A/P 14FR (CATHETERS) IMPLANT
CATH ROBINSON RED A/P 16FR (CATHETERS) ×2 IMPLANT
COVER BACK TABLE 60X90IN (DRAPES) ×2 IMPLANT
DEVICE MYOSURE LITE (MISCELLANEOUS) ×1 IMPLANT
DEVICE MYOSURE REACH (MISCELLANEOUS) IMPLANT
DILATOR CANAL MILEX (MISCELLANEOUS) IMPLANT
DRAPE HYSTEROSCOPY (DRAPE) ×2 IMPLANT
DRAPE LG THREE QUARTER DISP (DRAPES) ×2 IMPLANT
DRSG TELFA 3X8 NADH (GAUZE/BANDAGES/DRESSINGS) ×2 IMPLANT
FILTER ARTHROSCOPY CONVERTOR (FILTER) ×2 IMPLANT
GLOVE BIO SURGEON STRL SZ 6.5 (GLOVE) ×2 IMPLANT
GLOVE LITE  25/BX (GLOVE) ×1 IMPLANT
GOWN STRL REUS W/TWL LRG LVL3 (GOWN DISPOSABLE) ×4 IMPLANT
IV NS IRRIG 3000ML ARTHROMATIC (IV SOLUTION) ×3 IMPLANT
LEGGING LITHOTOMY PAIR STRL (DRAPES) ×2 IMPLANT
MYOSURE XL FIBROID REM (MISCELLANEOUS)
NDL SPNL 22GX5 LNG QUINC BK (NEEDLE) ×1 IMPLANT
NEEDLE SPNL 22GX5 LNG QUINC BK (NEEDLE) ×2 IMPLANT
PACK BASIN DAY SURGERY FS (CUSTOM PROCEDURE TRAY) ×2 IMPLANT
PAD DRESSING TELFA 3X8 NADH (GAUZE/BANDAGES/DRESSINGS) IMPLANT
PAD OB MATERNITY 4.3X12.25 (PERSONAL CARE ITEMS) ×2 IMPLANT
SEAL ROD LENS SCOPE MYOSURE (ABLATOR) ×2 IMPLANT
SYR CONTROL 10ML LL (SYRINGE) ×2 IMPLANT
SYSTEM TISS REMOVAL MYSR XL RM (MISCELLANEOUS) IMPLANT
TOWEL OR 17X24 6PK STRL BLUE (TOWEL DISPOSABLE) ×4 IMPLANT
TUBING AQUILEX INFLOW (TUBING) ×2 IMPLANT
TUBING AQUILEX OUTFLOW (TUBING) ×2 IMPLANT
WATER STERILE IRR 500ML POUR (IV SOLUTION) ×2 IMPLANT

## 2017-03-15 NOTE — Transfer of Care (Signed)
Immediate Anesthesia Transfer of Care Note  Patient: Sara Lyons  Procedure(s) Performed: Procedure(s): DILATATION & CURETTAGE/HYSTEROSCOPY WITH MYOSURE, VULVAR BIOPSY (N/A)  Patient Location: PACU  Anesthesia Type:General  Level of Consciousness: awake, alert , oriented and patient cooperative  Airway & Oxygen Therapy: Patient Spontanous Breathing and Patient connected to nasal cannula oxygen  Post-op Assessment: Report given to RN and Post -op Vital signs reviewed and stable  Post vital signs: Reviewed and stable  Last Vitals:  Vitals:   03/15/17 0927  BP: (!) 148/87  Pulse: 96  Resp: 18  Temp: 37.1 C    Last Pain:  Vitals:   03/15/17 0927  TempSrc: Oral      Patients Stated Pain Goal: 7 (40/45/91 3685)  Complications: No apparent anesthesia complications

## 2017-03-15 NOTE — Anesthesia Preprocedure Evaluation (Addendum)
Anesthesia Evaluation  Patient identified by MRN, date of birth, ID band Patient awake    Reviewed: Allergy & Precautions, NPO status , Patient's Chart, lab work & pertinent test results  History of Anesthesia Complications (+) PONV  Airway Mallampati: II  TM Distance: >3 FB Neck ROM: Full    Dental no notable dental hx. (+) Teeth Intact, Dental Advisory Given   Pulmonary neg pulmonary ROS,    Pulmonary exam normal breath sounds clear to auscultation       Cardiovascular hypertension, Pt. on medications negative cardio ROS Normal cardiovascular exam Rhythm:Regular Rate:Normal     Neuro/Psych negative neurological ROS  negative psych ROS   GI/Hepatic negative GI ROS, Neg liver ROS, GERD  Medicated and Controlled,  Endo/Other  negative endocrine ROSMorbid obesity  Renal/GU negative Renal ROS  negative genitourinary   Musculoskeletal negative musculoskeletal ROS (+) Arthritis ,   Abdominal   Peds negative pediatric ROS (+)  Hematology negative hematology ROS (+)   Anesthesia Other Findings   Reproductive/Obstetrics negative OB ROS                           Anesthesia Physical Anesthesia Plan  ASA: III  Anesthesia Plan: General   Post-op Pain Management:    Induction: Intravenous  PONV Risk Score and Plan: 4 or greater and Ondansetron, Dexamethasone, Propofol, Midazolam and Treatment may vary due to age or medical condition  Airway Management Planned: LMA  Additional Equipment:   Intra-op Plan:   Post-operative Plan: Extubation in OR  Informed Consent: I have reviewed the patients History and Physical, chart, labs and discussed the procedure including the risks, benefits and alternatives for the proposed anesthesia with the patient or authorized representative who has indicated his/her understanding and acceptance.   Dental advisory given  Plan Discussed with:  CRNA  Anesthesia Plan Comments:         Anesthesia Quick Evaluation

## 2017-03-15 NOTE — Anesthesia Postprocedure Evaluation (Signed)
Anesthesia Post Note  Patient: Sara Lyons  Procedure(s) Performed: Procedure(s) (LRB): DILATATION & CURETTAGE/HYSTEROSCOPY WITH MYOSURE, VULVAR BIOPSY (N/A)     Patient location during evaluation: PACU Anesthesia Type: General Level of consciousness: awake and alert and oriented Pain management: pain level controlled Vital Signs Assessment: post-procedure vital signs reviewed and stable Respiratory status: spontaneous breathing, nonlabored ventilation and respiratory function stable Cardiovascular status: blood pressure returned to baseline and stable Postop Assessment: no signs of nausea or vomiting Anesthetic complications: no    Last Vitals:  Vitals:   03/15/17 1334 03/15/17 1415  BP: (!) 162/86 114/87  Pulse: 84 77  Resp: 14 14  Temp:  36.5 C    Last Pain:  Vitals:   03/15/17 1415  TempSrc:   PainSc: 2                  Lamaya Hyneman A.

## 2017-03-15 NOTE — Discharge Instructions (Signed)
Hysteroscopy, Care After Refer to this sheet in the next few weeks. These instructions provide you with information on caring for yourself after your procedure. Your health care provider may also give you more specific instructions. Your treatment has been planned according to current medical practices, but problems sometimes occur. Call your health care provider if you have any problems or questions after your procedure. What can I expect after the procedure? After your procedure, it is typical to have the following:  You may have some cramping. This normally lasts for a couple days.  You may have bleeding. This can vary from light spotting for a few days to menstrual-like bleeding for 3-7 days.  Follow these instructions at home:  Rest for the first 1-2 days after the procedure.  Only take over-the-counter or prescription medicines as directed by your health care provider. Do not take aspirin. It can increase the chances of bleeding.  Take showers instead of baths for 2 weeks or as directed by your health care provider.  Do not drive for 24 hours or as directed.  Do not drink alcohol while taking pain medicine.  Do not use tampons, douche, or have sexual intercourse for 2 weeks or until your health care provider says it is okay.  Take your temperature twice a day for 4-5 days. Write it down each time.  Follow your health care provider's advice about diet, exercise, and lifting.  If you develop constipation, you may: ? Take a mild laxative if your health care provider approves. ? Add bran foods to your diet. ? Drink enough fluids to keep your urine clear or pale yellow.  Try to have someone with you or available to you for the first 24-48 hours, especially if you were given a general anesthetic.  Follow up with your health care provider as directed. Contact a health care provider if:  You feel dizzy or lightheaded.  You feel sick to your stomach (nauseous).  You have  abnormal vaginal discharge.  You have a rash.  You have pain that is not controlled with medicine. Get help right away if:  You have bleeding that is heavier than a normal menstrual period.  You have a fever.  You have increasing cramps or pain, not controlled with medicine.  You have new belly (abdominal) pain.  You pass out.  You have pain in the tops of your shoulders (shoulder strap areas).  You have shortness of breath. This information is not intended to replace advice given to you by your health care provider. Make sure you discuss any questions you have with your health care provider. Document Released: 06/20/2013 Document Revised: 02/05/2016 Document Reviewed: 03/29/2013 Elsevier Interactive Patient Education  2017 [employer]   Dilation and Curettage or Vacuum Curettage, Care After This sheet gives you information about how to care for yourself after your procedure. Your health care provider may also give you more specific instructions. If you have problems or questions, contact your health care provider. What can I expect after the procedure? After your procedure, it is common to have:  Mild pain or cramping.  Some vaginal bleeding or spotting.  These may last for up to 2 weeks after your procedure. Follow these instructions at home: Activity   Do not drive or use heavy machinery while taking prescription pain medicine.  Avoid driving for the first 24 hours after your procedure.  Take frequent, short walks, followed by rest periods, throughout the day. Ask your health care provider what activities are  safe for you. After 1-2 days, you may be able to return to your normal activities.  Do not lift anything heavier than 10 lb (4.5 kg) until your health care provider approves.  For at least 2 weeks, or as long as told by your health care provider, do not: ? Douche. ? Use tampons. ? Have sexual intercourse. General instructions   Take over-the-counter  and prescription medicines only as told by your health care provider. This is especially important if you take blood thinning medicine.  Do not take baths, swim, or use a hot tub until your health care provider approves. Take showers instead of baths.  Wear compression stockings as told by your health care provider. These stockings help to prevent blood clots and reduce swelling in your legs.  It is your responsibility to get the results of your procedure. Ask your health care provider, or the department performing the procedure, when your results will be ready.  Keep all follow-up visits as told by your health care provider. This is important. Contact a health care provider if:  You have severe cramps that get worse or that do not get better with medicine.  You have severe abdominal pain.  You cannot drink fluids without vomiting.  You develop pain in a different area of your pelvis.  You have bad-smelling vaginal discharge.  You have a rash. Get help right away if:  You have vaginal bleeding that soaks more than one sanitary pad in 1 hour, for 2 hours in a row.  You pass large blood clots from your vagina.  You have a fever that is above 100.58F (38.0C).  Your abdomen feels very tender or hard.  You have chest pain.  You have shortness of breath.  You cough up blood.  You feel dizzy or light-headed.  You faint.  You have pain in your neck or shoulder area. This information is not intended to replace advice given to you by your health care provider. Make sure you discuss any questions you have with your health care provider. Document Released: 08/27/2000 Document Revised: 04/28/2016 Document Reviewed: 04/01/2016 Elsevier Interactive Patient Education  2018 [place] may clean the vulvar biopsy sites with soap and water.  Just dry the areas lightly.  You do have dissolvable suture in each of the locations (right labia and perineum).     Post Anesthesia  Home Care Instructions  Activity: Get plenty of rest for the remainder of the day. A responsible individual must stay with you for 24 hours following the procedure.  For the next 24 hours, DO NOT: -Drive a car -Paediatric nurse -Drink alcoholic beverages -Take any medication unless instructed by your physician -Make any legal decisions or sign important papers.  Meals: Start with liquid foods such as gelatin or soup. Progress to regular foods as tolerated. Avoid greasy, spicy, heavy foods. If nausea and/or vomiting occur, drink only clear liquids until the nausea and/or vomiting subsides. Call your physician if vomiting continues.  Special Instructions/Symptoms: Your throat may feel dry or sore from the anesthesia or the breathing tube placed in your throat during surgery. If this causes discomfort, gargle with warm salt water. The discomfort should disappear within 24 hours.  If you had a scopolamine patch placed behind your ear for the management of post- operative nausea and/or vomiting:  1. The medication in the patch is effective for 72 hours, after which it should be removed.  Wrap patch in a tissue and discard in the trash.  trash. Wash hands thoroughly with soap and water. °2. You may remove the patch earlier than 72 hours if you experience unpleasant side effects which may include dry mouth, dizziness or visual disturbances. °3. Avoid touching the patch. Wash your hands with soap and water after contact with the patch. °  ° °

## 2017-03-15 NOTE — Anesthesia Procedure Notes (Signed)
Procedure Name: LMA Insertion Date/Time: 03/15/2017 12:02 PM Performed by: Wanita Chamberlain Pre-anesthesia Checklist: Patient identified, Timeout performed, Emergency Drugs available, Suction available and Patient being monitored Patient Re-evaluated:Patient Re-evaluated prior to inductionOxygen Delivery Method: Circle system utilized Preoxygenation: Pre-oxygenation with 100% oxygen Intubation Type: IV induction Ventilation: Mask ventilation without difficulty LMA: LMA with gastric port inserted LMA Size: 4.0 Number of attempts: 1 Placement Confirmation: positive ETCO2 and breath sounds checked- equal and bilateral Tube secured with: Tape Dental Injury: Teeth and Oropharynx as per pre-operative assessment

## 2017-03-15 NOTE — Brief Op Note (Signed)
03/15/2017  12:53 PM  PATIENT:  Sara Lyons  56 y.o. female  PRE-OPERATIVE DIAGNOSIS:  endometrial polyp, vulvar hypopigmentation, vulvar lesions  POST-OPERATIVE DIAGNOSIS:  endometrial polyp, vulvar hypopigmentation, vulvar lesions  PROCEDURE:  Procedure(s): DILATATION & CURETTAGE/HYSTEROSCOPY WITH MYOSURE (N/A)  Vulvar biopsies.   SURGEON:  Surgeon(s) and Role:    * Amundson Raliegh Ip, MD - Primary  PHYSICIAN ASSISTANT:  NA  ASSISTANTS: none   ANESTHESIA:   local and LMA and paracervical.   EBL:  Total I/O In: 600 [I.V.:600] Out: 65 [Urine:60; Blood:5]   NS deficit:  135 cc.  BLOOD ADMINISTERED:none  DRAINS: none   LOCAL MEDICATIONS USED:  LIDOCAINE   SPECIMEN:  Source of Specimen:  endometrial polyp, endometrial curettings, right labia, and perineum  DISPOSITION OF SPECIMEN:  PATHOLOGY  COUNTS:  YES  TOURNIQUET:  * No tourniquets in log *  DICTATION: .Note written in EPIC  PLAN OF CARE: Discharge to home after PACU  PATIENT DISPOSITION:  PACU - hemodynamically stable.   Delay start of Pharmacological VTE agent (>24hrs) due to surgical blood loss or risk of bleeding: not applicable

## 2017-03-15 NOTE — H&P (Signed)
Progress Notes Encounter Date: 03/03/2017 Nunzio Cobbs, MD  Obstetrics and Gynecology    [] Hide copied text GYNECOLOGY  VISIT   HPI: 56 y.o.   Married  Caucasian  female   G0P0000 with Patient's last menstrual period was 06/13/2016 (approximate).   here for  Pre-op consult    Seen for amenorrhea and perimenopausal hormonal levels. EMB showed benign polyp. Follow up ultrasound showed fibroid including 32. 21 mm ? Pedunculated fibroid.   Right adnexa absent.   Left ovary 1.9 x 1.6 x 1.0 cm.  Medial to left ovary and infection to cervix is a solid mass 10 x 7 x 8 mm which is avascular consistent with possible endometriosis. Sonohysterogram showed filling defect 13 X 7 mm.   She states a hx of advanced endometriosis and very painful menses while she was having them.   Vulvar itching for 3 months.  No FH reaction to anesthesia.  GYNECOLOGIC HISTORY: Patient's last menstrual period was 06/13/2016 (approximate). Contraception:  Perimenopausal/abstinence Menopausal hormone therapy:  n/a Last mammogram:  10-29-16 Density B/Neg/BiRads1:TBC Last pap smear:   11-04-14 Neg:Neg HR HPV; 10-04-11 Neg:Neg HR HPV                OB History    Gravida Para Term Preterm AB Living   0 0 0 0 0 0   SAB TAB Ectopic Multiple Live Births   0 0 0 0 0             Patient Active Problem List   Diagnosis Date Noted  . Family history of breast cancer in first degree relative 11/10/2016  . Essential hypertension 11/10/2016  . Adult situational stress disorder 11/10/2016  . Amenorrhea 11/10/2016        Past Medical History:  Diagnosis Date  . Hyperlipidemia   . Hypertension   . Migraine    no aura         Past Surgical History:  Procedure Laterality Date  . APPENDECTOMY  2005  . CHOLECYSTECTOMY, LAPAROSCOPIC     with Left oophorectomy  . HYSTEROSCOPY  10/26/10   D&C, polyp resection, benign path  . PELVIC LAPAROSCOPY  11/21/08   exp lap with  Left salpingo-oophorectomy          Current Outpatient Prescriptions  Medication Sig Dispense Refill  . amLODipine (NORVASC) 2.5 MG tablet Take 1 tablet by mouth daily.  0  . atorvastatin (LIPITOR) 20 MG tablet Take 20 mg by mouth daily.  0  . IBUPROFEN PO Take by mouth as needed.    . Ibuprofen-Diphenhydramine Cit (ADVIL PM PO) Take by mouth as needed.    . Multiple Vitamins-Minerals (MULTI FOR HER) PACK Take 1 each by mouth daily.    Marland Kitchen omeprazole (PRILOSEC) 20 MG capsule Take 1 capsule (20 mg total) by mouth 2 (two) times daily before a meal. 28 capsule 0  . triamterene-hydrochlorothiazide (MAXZIDE-25) 37.5-25 MG per tablet Take 1 tablet by mouth daily.     No current facility-administered medications for this visit.      ALLERGIES: Demerol [meperidine] and Pravastatin       Family History  Problem Relation Age of Onset  . Breast cancer Mother        after 75  . Esophageal cancer Mother 70  . Osteoporosis Mother   . Hypertension Mother   . Hypertension Father   . Heart attack Father   . Crohn's disease Sister   . Diabetes Sister   . Breast cancer Sister  66       stage III bilateral masterctomy, ? BRCA  . Osteoporosis Maternal Grandmother     Social History        Social History  . Marital status: Married    Spouse name: N/A  . Number of children: 1  . Years of education: N/A      Occupational History  . Not on file.        Social History Main Topics  . Smoking status: Never Smoker  . Smokeless tobacco: Never Used  . Alcohol use No  . Drug use: No  . Sexual activity: Not Currently    Partners: Male    Birth control/ protection: None       Other Topics Concern  . Not on file      Social History Narrative   Works as Animal nutritionist    ROS:  Pertinent items are noted in HPI.  PHYSICAL EXAMINATION:    BP 110/76   Pulse 72   Resp 16   Ht 5' 3"  (1.6 m)   Wt 248 lb (112.5 kg)   LMP 06/13/2016  (Approximate)   BMI 43.93 kg/m     General appearance: alert, cooperative and appears stated age Head: Normocephalic, without obvious abnormality, atraumatic Neck: no adenopathy, supple, symmetrical, trachea midline and thyroid normal to inspection and palpation Lungs: clear to auscultation bilaterally Breasts: normal appearance, no masses or tenderness, No nipple retraction or dimpling, No nipple discharge or bleeding, No axillary or supraclavicular adenopathy Heart: regular rate and rhythm Abdomen: soft, non-tender, no masses,  no organomegaly Extremities: extremities normal, atraumatic, no cyanosis or edema Skin: Skin color, texture, turgor normal. No rashes or lesions Lymph nodes: Cervical, supraclavicular, and axillary nodes normal. No abnormal inguinal nodes palpated Neurologic: Grossly normal  Pelvic: External genitalia:   Hypopigmented vulva.  Some fissure formation.                Urethra:  normal appearing urethra with no masses, tenderness or lesions              Bartholins and Skenes: normal                 Vagina: normal appearing vagina with normal color and discharge, no lesions              Cervix: no lesions                Bimanual Exam:  Uterus:  normal size, contour, position, consistency, mobility, non-tender              Adnexa: no mass, fullness, tenderness            Chaperone was present for exam.  ASSESSMENT  Endometrial polyp.  Fibroids.  Status post USO.  Left adnexal mass, possible endometrioma.  Vulvar hypopigmentation and fissures consistent with possible lichen sclerosus.   PLAN  Plan for CMP, CBC, CEA, CA125.  Proceed with hysteroscopic resection of endometrial mass, dilation and curettage. Risks, benefits, and alternatives discussed with the patient who wishes to proceed. Will add vulvar biopsy to surgical procedure.  Lichen sclerosus discussed and anticipated tx with steroids.   An After Visit Summary was printed and given to the  patient.  __25____ minutes face to face time of which over 50% was spent in counseling.      Electronically signed by Nunzio Cobbs, MD at 03/04/2017 10:20 AM

## 2017-03-15 NOTE — Progress Notes (Signed)
Update to History and Physical Exam  No marked change in status since office preop visit.  Patient examined.   Ok to proceed with surgery.

## 2017-03-15 NOTE — Op Note (Signed)
OPERATIVE REPORT   PREOPERATIVE DIAGNOSES:   Endometrial polyp, vulvar hypopigmentation, vulvar lesions.   POSTOPERATIVE DIAGNOSES:   Endometrial polyp, vulvar hypopigmentation, vulvar lesions.   PROCEDURE:  Hysteroscopy with dilation and curettage and Myosure resection of endometrial polyps, vulvar biopsies.  SURGEON:  Lenard Galloway, MD  ANESTHESIA:  LMA, paracervical block with 10 mL of 1% lidocaine.  IV FLUIDS:   600 cc LR.   EBL:  5 cc.  URINE OUTPUT:  10 cc.   NORMAL SALINE DEFICIT:   135 cc.  COMPLICATIONS:  None.  INDICATIONS FOR THE PROCEDURE:     The patient is a 56 year old Caucasian female who presents for endometrial monitoring due to amenorrhea and perimenopausal hormonal levels.  She has a finding of endometrial polyp on sonohysterogram/endomerial biopsy evaluation.  A plan is now made to proceed with a hysteroscopy with dilation and curettage, Myosure resection of endometrial polyp, and vulvar biopsies; after risks, benefits and alternatives were reviewed.  FINDINGS:  Exam under anesthesia revealed a small mobile uterus with no masses. The uterus was sounded to 7.5 cm.  Hysteroscopy showed a 1 cm polyp at the uterine fundus.  Each tubal ostial region contained a small 4 - 5 mm polyp.  All polyps were removed.  No fibroids were seen in the uterine cavity.  Endometrial currettings were scant.  The vulva demonstrated a ring of hypopigmentation encircling the entire vulva including the labia, clitoral region, and the perineum.  The right superior labia minora had a 6 mm area of erythema within the hypopigmented region and a biopsy was taken here.  The perineum just above the anus has some fissure formation.   SPECIMENS:  Endometrial polyps, endometrial curettings, right labial biopsy and perineal biopsy were sent to Pathology separately.  PROCEDURE IN DETAIL:  The patient was reidentified in the preoperative hold area.  She received TED hose  and PAS stockings for DVT prophylaxis.  In the operating room, the patient was placed in the dorsal lithotomy position and then an LMA anesthetic was introduced.  The patient's lower abdomen, vagina and perineum were sterilely prepped and the  patient's bladder was catheterized of urine.  She was sterilly draped  An exam under anesthesia was performed.  A speculum was placed inside the vagina and a single-tooth tenaculum was placed on the anterior cervical lip.  A paracervical block was performed with a total of 10 mL of 1% lidocaine plain.  The uterus was sounded. The cervix was dilated to a #21 Pratt dilator.  The MyoSure hysteroscope was then inserted inside the uterine cavity under the continuous infusion of normal saline solution.  Findings are as noted above.  The MyoSure hysteroscope was used to remove the polyps, and the specimen was sent to Pathology.  The hysteroscope was removed.  The cervix was further  dilated to a #23 Pratt dilator.  The sharp and then a serrated curette were introduced into the uterine cavity and the endometrium was curetted in all 4  quadrants.  A minimal amount of endometrial curettings was obtained.  This specimen was sent to Pathology.    The single-tooth tenaculum which had been placed on the anterior cervical lip was removed.  Hemostasis was good, and all of the vaginal  instruments were removed.  Local 1% lidocaine was injected into the right labia minora and the perineum. A 4 mm punch was used to biopsy each of these regions.  The biopsy sites  were sutured with simple sutures of 4/0 Vicryl.  Hemostasis was good at all operative sites.   The patient was awakened and escorted to the recovery room in stable condition after she was cleansed of the sterile prep solution.  There were no complications to the procedure.  All needle, instrument and sponge counts were correct.  Lenard Galloway, MD

## 2017-03-17 ENCOUNTER — Encounter (HOSPITAL_BASED_OUTPATIENT_CLINIC_OR_DEPARTMENT_OTHER): Payer: Self-pay | Admitting: Obstetrics and Gynecology

## 2017-03-17 ENCOUNTER — Telehealth: Payer: Self-pay | Admitting: *Deleted

## 2017-03-17 NOTE — Telephone Encounter (Signed)
Both EKG results in EPIC faxed to Dr Donavan Burnet office.

## 2017-03-17 NOTE — Telephone Encounter (Signed)
Call to patient. States feeling very well post-op. Had some cramping evening of surgery which decreased yesterday and has resolved today. Voiding well and no vaginal bleeding. Feels great. Advised of recommendation from Dr Quincy Simmonds to follow-up with PCP regarding possible change noted on EKG from previous EKG on file. Discussed this may just be lead placement but Dr Quincy Simmonds recommended PCP evaluate for additional recommendations.   Patient has appointment already scheduled with Dr Desiree Hane in early August. Requests we send records for review and see if needs earlier appointment.  Routing to provider for final review. Patient agreeable to disposition. Will close encounter.

## 2017-03-21 ENCOUNTER — Encounter: Payer: Self-pay | Admitting: Obstetrics and Gynecology

## 2017-03-30 ENCOUNTER — Ambulatory Visit (INDEPENDENT_AMBULATORY_CARE_PROVIDER_SITE_OTHER): Payer: BC Managed Care – PPO | Admitting: Obstetrics and Gynecology

## 2017-03-30 ENCOUNTER — Encounter: Payer: Self-pay | Admitting: Obstetrics and Gynecology

## 2017-03-30 VITALS — BP 122/80 | HR 88 | Resp 16 | Wt 249.0 lb

## 2017-03-30 DIAGNOSIS — Z9889 Other specified postprocedural states: Secondary | ICD-10-CM | POA: Diagnosis not present

## 2017-03-30 DIAGNOSIS — L900 Lichen sclerosus et atrophicus: Secondary | ICD-10-CM | POA: Diagnosis not present

## 2017-03-30 DIAGNOSIS — R1900 Intra-abdominal and pelvic swelling, mass and lump, unspecified site: Secondary | ICD-10-CM | POA: Diagnosis not present

## 2017-03-30 MED ORDER — CLOBETASOL PROPIONATE 0.05 % EX OINT: 1.0000 "application " | TOPICAL_OINTMENT | Freq: Two times a day (BID) | CUTANEOUS | 0 refills | Status: DC

## 2017-03-30 NOTE — Progress Notes (Signed)
GYNECOLOGY  VISIT   HPI: 56 y.o.   Married  Caucasian  female   G0P0000 with No LMP recorded. Patient is not currently having periods (Reason: Perimenopausal).   here for DILATATION & CURETTAGE/HYSTEROSCOPY WITH MYOSURE, VULVAR BIOPSY (N/A Vagina )    Final pathology:  Benign endometrial polyp and lichen sclerosus.   Had very little cramping and bleeding following her surgery.  Had bleed and had cramping for a few hours on 03/22/17.  [place] 33 and estradiol 24 on 12/13/16.   Has a small left adnexal mass we are following.  She has a hx of endometriosis.  Normal CA125 and CEA.   GYNECOLOGIC HISTORY: No LMP recorded. Patient is not currently having periods (Reason: Perimenopausal). Contraception:  Abstinence  Menopausal hormone therapy:  n/a Last mammogram:  10/29/16 Density B/Neg/BiRads1:TBC Last pap smear:   11-04-14 Neg:Neg HR HPV; 10-04-11 Neg:Neg HR HPV        OB History    Gravida Para Term Preterm AB Living   0 0 0 0 0 0   SAB TAB Ectopic Multiple Live Births   0 0 0 0 0         Patient Active Problem List   Diagnosis Date Noted  . Family history of breast cancer in first degree relative 11/10/2016  . Essential hypertension 11/10/2016  . Adult situational stress disorder 11/10/2016  . Amenorrhea 11/10/2016    Past Medical History:  Diagnosis Date  . Arthritis    knees, hands  . Cataract, mature    right  . Endometrial polyp   . GERD (gastroesophageal reflux disease)   . History of endometriosis   . History of migraine    yrs ago  . History of ovarian cyst    s/p  left oophorectomy 03/ 2010  . Hyperlipidemia   . Hypertension   . Lichen sclerosus 2536  . Perimenopausal   . PONV (postoperative nausea and vomiting)   . Uterine fibroid     Past Surgical History:  Procedure Laterality Date  . APPENDECTOMY  2005  . CATARACT EXTRACTION W/ INTRAOCULAR LENS IMPLANT Right 03/2016  . COLONOSCOPY  10/2011  . D & C HYSTEROSCOPY W/ RESECTION POLYPS  10-26-2010  dr  Joan Flores at [facility]  . DILATATION & CURETTAGE/HYSTEROSCOPY WITH MYOSURE N/A 03/15/2017   Procedure: DILATATION & CURETTAGE/HYSTEROSCOPY WITH MYOSURE, VULVAR BIOPSY;  Surgeon: Nunzio Cobbs, MD;  Location: [facility];  Service: Gynecology;  Laterality: N/A;  . LAPAROSCOPIC CHOLECYSTECTOMY  03/ 2010   at [facility]   and Exp. Lap. w/ Left Oophorectomy (cyst)  . WISDOM TOOTH EXTRACTION      Current Outpatient Prescriptions  Medication Sig Dispense Refill  . amLODipine (NORVASC) 2.5 MG tablet Take 1 tablet by mouth every morning.   0  . atorvastatin (LIPITOR) 20 MG tablet Take 20 mg by mouth every morning.   0  . ibuprofen (ADVIL,MOTRIN) 800 MG tablet Take 1 tablet (800 mg total) by mouth every 8 (eight) hours as needed. 30 tablet 0  . Multiple Vitamins-Minerals (MULTI FOR HER) PACK Take 1 each by mouth daily.    Marland Kitchen omeprazole (PRILOSEC) 20 MG capsule Take 1 capsule (20 mg total) by mouth 2 (two) times daily before a meal. (Patient taking differently: Take 20 mg by mouth every morning. ) 28 capsule 0  . triamterene-hydrochlorothiazide (MAXZIDE-25) 37.5-25 MG per tablet Take 1 tablet by mouth every morning.      No current facility-administered medications for this visit.  ALLERGIES: Demerol [meperidine] and Pravastatin  Family History  Problem Relation Age of Onset  . Breast cancer Mother        after 56  . Esophageal cancer Mother 9  . Osteoporosis Mother   . Hypertension Mother   . Hypertension Father   . Heart attack Father   . Crohn's disease Sister   . Diabetes Sister   . Breast cancer Sister 21       stage III bilateral masterctomy, ? BRCA  . Osteoporosis Maternal Grandmother     Social History   Social History  . Marital status: Married    Spouse name: N/A  . Number of children: 1  . Years of education: N/A   Occupational History  . Not on file.   Social History Main Topics  . Smoking status: Never Smoker  . Smokeless tobacco:  Never Used  . Alcohol use 0.0 oz/week     Comment: very rare  . Drug use: No  . Sexual activity: Not Currently    Partners: Male    Birth control/ protection: None   Other Topics Concern  . Not on file   Social History Narrative   Works as Animal nutritionist    ROS:  Pertinent items are noted in HPI.  PHYSICAL EXAMINATION:    BP 122/80 (BP Location: Right Arm, Patient Position: Sitting, Cuff Size: Normal)   Pulse 88   Resp 16   Wt 249 lb (112.9 kg)   BMI 42.74 kg/m     General appearance: alert, cooperative and appears stated age  Pelvic: External genitalia: hypopigmentation of the vulva and perianal region.  Small fissure above the clitoris.               Urethra:  normal appearing urethra with no masses, tenderness or lesions              Bartholins and Skenes: normal                 Vagina: normal appearing vagina with normal color and discharge, no lesions              Cervix: no lesions                Bimanual Exam:  Uterus:  normal size, contour, position, consistency, mobility, non-tender              Adnexa: no mass, fullness, tenderness                          Chaperone was present for exam.  ASSESSMENT  Status post hysteroscopy with Myosure polypectomy, dilation and curettage.  Lichen sclerosus.  Left adnexal mass - small, more solid and nonspecific.  Normal CA125.   PLAN  Reviewed surgical findings, procedure, and pathology report.  Dicussed lichen sclerosus and tx with Clobetasol ointment.  Instructed in use. Discussed potential for adrenal suppression with excessive use.  Printed Rx for her today.  Return for pelvic ultrasound and recheck of lichen sclerosus in 4 weeks.   An After Visit Summary was printed and given to the patient.  __15____ minutes face to face time of which over 50% was spent in counseling.

## 2017-06-01 ENCOUNTER — Telehealth: Payer: Self-pay | Admitting: Obstetrics and Gynecology

## 2017-06-01 NOTE — Telephone Encounter (Signed)
Call placed topatient to review benefits for three month follow up ultrasound. Left voicemail requesting a return call.

## 2017-06-02 NOTE — Telephone Encounter (Signed)
Spoke with patient regarding benefit for follow up ultrasound. Patient understood and agreeable. Patient ready to schedule. Patient scheduled 06/30/17 with Dr Quincy Simmonds. Earlier appointment dates were offered, but patient declined due to work schedule. Patient aware of appointment date, arrival time and cancellation policy. No further questions.    cc: Dr Quincy Simmonds

## 2017-06-15 ENCOUNTER — Telehealth: Payer: Self-pay | Admitting: Obstetrics and Gynecology

## 2017-06-15 NOTE — Telephone Encounter (Signed)
Patient called to cancel ultrasound appointment for 06/30/17. Patient states she has a conflict on that date. Patient rescheduled appointment on 07/28/17 with Dr Quincy Simmonds. 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 Patient is aware of the appointment date, arrival time and cancellation policy. Patient had no further concerns.  Routing to Dr Quincy Simmonds for final review.

## 2017-06-15 NOTE — Telephone Encounter (Signed)
I reviewed this note and closed the encounter.

## 2017-06-30 ENCOUNTER — Other Ambulatory Visit: Payer: BC Managed Care – PPO | Admitting: Obstetrics and Gynecology

## 2017-06-30 ENCOUNTER — Other Ambulatory Visit: Payer: BC Managed Care – PPO

## 2017-07-28 ENCOUNTER — Ambulatory Visit (INDEPENDENT_AMBULATORY_CARE_PROVIDER_SITE_OTHER): Payer: BC Managed Care – PPO | Admitting: Obstetrics and Gynecology

## 2017-07-28 ENCOUNTER — Encounter: Payer: Self-pay | Admitting: Obstetrics and Gynecology

## 2017-07-28 ENCOUNTER — Ambulatory Visit (INDEPENDENT_AMBULATORY_CARE_PROVIDER_SITE_OTHER): Payer: BC Managed Care – PPO

## 2017-07-28 ENCOUNTER — Other Ambulatory Visit: Payer: Self-pay | Admitting: Obstetrics and Gynecology

## 2017-07-28 VITALS — BP 140/82 | HR 76 | Ht 63.0 in | Wt 249.8 lb

## 2017-07-28 DIAGNOSIS — R1900 Intra-abdominal and pelvic swelling, mass and lump, unspecified site: Secondary | ICD-10-CM | POA: Diagnosis not present

## 2017-07-28 DIAGNOSIS — N949 Unspecified condition associated with female genital organs and menstrual cycle: Secondary | ICD-10-CM | POA: Diagnosis not present

## 2017-07-28 NOTE — Progress Notes (Signed)
GYNECOLOGY  VISIT   HPI: 56 y.o.   Married  Caucasian  female   G0P0000 with Patient's last menstrual period was 06/13/2016 (approximate).  Patient did have some spotting 03-22-17. here for pelvic ultrasound for 6 month follow up of left adnexal region.    Had a little bit of spotting on 03/22/17 following her hysteroscopy on 03/15/17. Surgical path - benign endometrial polyp and lichen sclerosus.   Uses Clobetasol as needed. Bottom feels so much better.  GYNECOLOGIC HISTORY: Patient's last menstrual period was 06/13/2016 (approximate). Contraception: Abstinence Menopausal hormone therapy:  none Last mammogram:  10/29/16 Density B/Neg/BiRads1:TBC Last pap smear:  11-04-14 Neg:Neg HR HPV; 10-04-11 Neg:Neg HR HPV         OB History    Gravida Para Term Preterm AB Living   0 0 0 0 0 0   SAB TAB Ectopic Multiple Live Births   0 0 0 0 0         Patient Active Problem List   Diagnosis Date Noted  . Family history of breast cancer in first degree relative 11/10/2016  . Essential hypertension 11/10/2016  . Adult situational stress disorder 11/10/2016  . Amenorrhea 11/10/2016    Past Medical History:  Diagnosis Date  . Adnexal cyst 2018   left, 1.1 cm, stable on ultrasound.   . Arthritis    knees, hands  . Cataract, mature    right  . Endometrial polyp   . GERD (gastroesophageal reflux disease)   . History of endometriosis   . History of migraine    yrs ago  . History of ovarian cyst    s/p  left oophorectomy 03/ 2010  . Hyperlipidemia   . Hypertension   . Lichen sclerosus 2376  . Perimenopausal   . PONV (postoperative nausea and vomiting)   . Uterine fibroid     Past Surgical History:  Procedure Laterality Date  . APPENDECTOMY  2005  . CATARACT EXTRACTION W/ INTRAOCULAR LENS IMPLANT Right 03/2016  . COLONOSCOPY  10/2011  . D & C HYSTEROSCOPY W/ RESECTION POLYPS  10-26-2010  dr Joan Flores at [facility]  . DILATATION & CURETTAGE/HYSTEROSCOPY WITH MYOSURE N/A 03/15/2017   Procedure:  DILATATION & CURETTAGE/HYSTEROSCOPY WITH MYOSURE, VULVAR BIOPSY;  Surgeon: Nunzio Cobbs, MD;  Location: [facility];  Service: Gynecology;  Laterality: N/A;  . LAPAROSCOPIC CHOLECYSTECTOMY  03/ 2010   at [facility]   and Exp. Lap. w/ Left Oophorectomy (cyst)  . WISDOM TOOTH EXTRACTION      Current Outpatient Medications  Medication Sig Dispense Refill  . amLODipine (NORVASC) 2.5 MG tablet Take 1 tablet by mouth every morning.   0  . atorvastatin (LIPITOR) 20 MG tablet Take 20 mg by mouth every morning.   0  . clobetasol ointment (TEMOVATE) 2.83 % Apply 1 application topically 2 (two) times daily. Use this for one month.  Use it for 1 - 2 weeks as needed for a flare for future symptoms. 60 g 0  . ibuprofen (ADVIL,MOTRIN) 800 MG tablet Take 1 tablet (800 mg total) by mouth every 8 (eight) hours as needed. 30 tablet 0  . Multiple Vitamins-Minerals (MULTI FOR HER) PACK Take 1 each by mouth daily.    Marland Kitchen omeprazole (PRILOSEC) 20 MG capsule Take 1 capsule (20 mg total) by mouth 2 (two) times daily before a meal. (Patient taking differently: Take 20 mg by mouth every morning. ) 28 capsule 0  . triamterene-hydrochlorothiazide (MAXZIDE-25) 37.5-25 MG per tablet Take  1 tablet by mouth every morning.     Marland Kitchen UNABLE TO FIND Take by mouth.    Marland Kitchen UNABLE TO FIND Take by mouth.     No current facility-administered medications for this visit.      ALLERGIES: Demerol [meperidine] and Pravastatin  Family History  Problem Relation Age of Onset  . Breast cancer Mother        after 51  . Esophageal cancer Mother 41  . Osteoporosis Mother   . Hypertension Mother   . Hypertension Father   . Heart attack Father   . Crohn's disease Sister   . Diabetes Sister   . Breast cancer Sister 69       stage III bilateral masterctomy, ? BRCA  . Osteoporosis Maternal Grandmother     Social History   Socioeconomic History  . Marital status: Married    Spouse name: Not on file  .  Number of children: 1  . Years of education: Not on file  . Highest education level: Not on file  Social Needs  . Financial resource strain: Not on file  . Food insecurity - worry: Not on file  . Food insecurity - inability: Not on file  . Transportation needs - medical: Not on file  . Transportation needs - non-medical: Not on file  Occupational History  . Not on file  Tobacco Use  . Smoking status: Never Smoker  . Smokeless tobacco: Never Used  Substance and Sexual Activity  . Alcohol use: Yes    Alcohol/week: 0.0 oz    Comment: very rare  . Drug use: No  . Sexual activity: Not Currently    Partners: Male    Birth control/protection: None  Other Topics Concern  . Not on file  Social History Narrative   Works as Animal nutritionist    ROS:  Pertinent items are noted in HPI.  PHYSICAL EXAMINATION:    BP 140/82 (BP Location: Right Arm, Patient Position: Sitting, Cuff Size: Large)   Pulse 76   Ht 5' 3"  (1.6 m)   Wt 249 lb 12.8 oz (113.3 kg)   LMP 06/13/2016 (Approximate)   BMI 44.25 kg/m     General appearance: alert, cooperative and appears stated age   Pelvic US: Uterus with 2 small fibroids . EMS 3 mm.  Left adnexa with 1.1 cm cystic area, avascular and unchanged.  Adherent to the left uterine sidewall.  Left ovary normal.  Right adnexa absent.  No free fluid.  ASSESSMENT  Left adnexal cyst.  Stable.  This may represent a paratubal or paraovarian cyst or old endometriosis and scarring.  Status post RSO.  Hx endometriosis.   PLAN  Will follow this clinically.  Return for annual exam in March 2019.  Call for bleeding or pain.    An After Visit Summary was printed and given to the patient.  __15____ minutes face to face time of which over 50% was spent in counseling.

## 2017-11-15 ENCOUNTER — Ambulatory Visit: Payer: BC Managed Care – PPO | Admitting: Nurse Practitioner

## 2017-11-17 ENCOUNTER — Ambulatory Visit: Payer: BC Managed Care – PPO | Admitting: Obstetrics and Gynecology

## 2018-06-30 ENCOUNTER — Other Ambulatory Visit: Payer: Self-pay | Admitting: Physician Assistant

## 2018-06-30 DIAGNOSIS — Z13820 Encounter for screening for osteoporosis: Secondary | ICD-10-CM

## 2018-06-30 DIAGNOSIS — Z1231 Encounter for screening mammogram for malignant neoplasm of breast: Secondary | ICD-10-CM

## 2018-07-04 ENCOUNTER — Ambulatory Visit
Admission: RE | Admit: 2018-07-04 | Discharge: 2018-07-04 | Disposition: A | Discharge status: Home / Self Care | Payer: BC Managed Care – PPO | Source: Ambulatory Visit | Attending: Physician Assistant | Admitting: Physician Assistant

## 2018-07-04 DIAGNOSIS — Z13820 Encounter for screening for osteoporosis: Secondary | ICD-10-CM

## 2018-07-04 DIAGNOSIS — Z1231 Encounter for screening mammogram for malignant neoplasm of breast: Secondary | ICD-10-CM

## 2019-08-14 ENCOUNTER — Other Ambulatory Visit: Payer: Self-pay | Admitting: Internal Medicine

## 2019-08-14 DIAGNOSIS — Z1231 Encounter for screening mammogram for malignant neoplasm of breast: Secondary | ICD-10-CM

## 2019-10-03 ENCOUNTER — Ambulatory Visit
Admission: RE | Admit: 2019-10-03 | Discharge: 2019-10-03 | Disposition: A | Discharge status: Home / Self Care | Payer: BC Managed Care – PPO | Source: Ambulatory Visit | Attending: Internal Medicine | Admitting: Internal Medicine

## 2019-10-03 ENCOUNTER — Other Ambulatory Visit: Payer: Self-pay

## 2019-10-03 DIAGNOSIS — Z1231 Encounter for screening mammogram for malignant neoplasm of breast: Secondary | ICD-10-CM

## 2019-10-15 DIAGNOSIS — U071 COVID-19: Secondary | ICD-10-CM

## 2019-10-15 HISTORY — DX: COVID-19: U07.1

## 2021-01-26 ENCOUNTER — Other Ambulatory Visit: Payer: Self-pay | Admitting: Family Medicine

## 2021-01-26 DIAGNOSIS — Z1231 Encounter for screening mammogram for malignant neoplasm of breast: Secondary | ICD-10-CM

## 2021-03-23 ENCOUNTER — Ambulatory Visit
Admission: RE | Admit: 2021-03-23 | Discharge: 2021-03-23 | Disposition: A | Discharge status: Home / Self Care | Payer: BC Managed Care – PPO | Source: Ambulatory Visit | Attending: Family Medicine | Admitting: Family Medicine

## 2021-03-23 ENCOUNTER — Other Ambulatory Visit: Payer: Self-pay

## 2021-03-23 DIAGNOSIS — Z1231 Encounter for screening mammogram for malignant neoplasm of breast: Secondary | ICD-10-CM

## 2021-06-01 ENCOUNTER — Ambulatory Visit: Payer: BC Managed Care – PPO | Admitting: Obstetrics and Gynecology

## 2021-06-01 ENCOUNTER — Other Ambulatory Visit (HOSPITAL_COMMUNITY)
Admission: RE | Admit: 2021-06-01 | Discharge: 2021-06-01 | Disposition: A | Discharge status: Home / Self Care | Payer: BC Managed Care – PPO | Source: Ambulatory Visit | Attending: Obstetrics and Gynecology | Admitting: Obstetrics and Gynecology

## 2021-06-01 ENCOUNTER — Encounter: Payer: Self-pay | Admitting: Obstetrics and Gynecology

## 2021-06-01 ENCOUNTER — Other Ambulatory Visit: Payer: Self-pay

## 2021-06-01 VITALS — BP 138/76 | HR 110 | Ht 63.5 in | Wt 236.0 lb

## 2021-06-01 DIAGNOSIS — Z01419 Encounter for gynecological examination (general) (routine) without abnormal findings: Secondary | ICD-10-CM | POA: Diagnosis not present

## 2021-06-01 MED ORDER — CLOBETASOL PROPIONATE 0.05 % EX OINT: TOPICAL_OINTMENT | CUTANEOUS | 0 refills | Status: AC

## 2021-06-01 NOTE — Patient Instructions (Signed)

## 2021-06-01 NOTE — Progress Notes (Signed)
60 y.o. G0P0000 Married Caucasian female here for annual exam.    She is still having occasional flare ups with Lichen Sclerosus. Used Clobetasol ointment for a flare.  Needs a refill.   No vaginal bleeding.   More heat intolerance since having Covid.   Doing intermittent fasting and loves it.  Lost 25 pounds. Cannot do walking due to herniated disc and arthritis.  She is getting help for this.   PCP:   Dineen Kid, MD  Patient's last menstrual period was 06/13/2016 (approximate).           Sexually active: Yes.    The current method of family planning is post menopausal status.    Exercising: No.  The patient does not participate in regular exercise at present.  Has back issues Smoker:  no  Health Maintenance: Pap:  11-04-14 Neg:Neg HR HPV; 10-04-11 Neg:Neg HR HPV  History of abnormal Pap:  no MMG:  03-23-21 3D/Neg/BiRads1 Colonoscopy: age 7 and one scheduled 06-26-21 BMD:  07-04-18 Result :Osteopenia TDaP: PCP Gardasil:   no HIV:no Hep C:no Screening Labs:  PCP   reports that she has never smoked. She has never used smokeless tobacco. She reports that she does not currently use alcohol. She reports that she does not use drugs.  Past Medical History:  Diagnosis Date   Adnexal cyst 2018   left, 1.1 cm, stable on ultrasound.    Arthritis    knees, hands   Cataract, mature    right   COVID 10/2019   Endometrial polyp    GERD (gastroesophageal reflux disease)    History of endometriosis    History of migraine    yrs ago   History of ovarian cyst    s/p  left oophorectomy 03/ 2010   Hyperlipidemia    Hypertension    Lichen sclerosus 8657   Perimenopausal    PONV (postoperative nausea and vomiting)    Uterine fibroid     Past Surgical History:  Procedure Laterality Date   APPENDECTOMY  2005   CATARACT EXTRACTION W/ INTRAOCULAR LENS IMPLANT Right 03/2016   COLONOSCOPY  10/2011   D & C HYSTEROSCOPY W/ RESECTION POLYPS  10-26-2010  dr Joan Flores at [place] N/A 03/15/2017   Procedure: [place], VULVAR BIOPSY;  Surgeon: Nunzio Cobbs, MD;  Location: [facility];  Service: Gynecology;  Laterality: N/A;   LAPAROSCOPIC CHOLECYSTECTOMY  03/ 2010   at [facility]   and Exp. Lap. w/ Left Oophorectomy (cyst)   WISDOM TOOTH EXTRACTION      Current Outpatient Medications  Medication Sig Dispense Refill   amLODipine (NORVASC) 2.5 MG tablet Take 1 tablet by mouth every morning.   0   clobetasol ointment (TEMOVATE) 8.46 % Apply 1 application topically 2 (two) times daily. Use this for one month.  Use it for 1 - 2 weeks as needed for a flare for future symptoms. 60 g 0   gabapentin (NEURONTIN) 300 MG capsule Take 2 capsules by mouth at bedtime.     ibuprofen (ADVIL,MOTRIN) 800 MG tablet Take 1 tablet (800 mg total) by mouth every 8 (eight) hours as needed. 30 tablet 0   Magnesium 250 MG TABS Take 1 tablet by mouth at bedtime.     omeprazole (PRILOSEC) 20 MG capsule Take 1 capsule (20 mg total) by mouth 2 (two) times daily before a meal. (Patient taking differently: Take 20 mg by mouth  every morning.) 28 capsule 0   triamterene-hydrochlorothiazide (MAXZIDE-25) 37.5-25 MG per tablet Take 1 tablet by mouth every morning.      VITAMIN D PO Take by mouth. Takes 5000 IU daily     zinc gluconate 50 MG tablet Take 50 mg by mouth daily.     No current facility-administered medications for this visit.    Family History  Problem Relation Age of Onset   Breast cancer Mother        after 9   Esophageal cancer Mother 15   Osteoporosis Mother    Hypertension Mother    Hypertension Father    Heart attack Father    Crohn's disease Sister    Diabetes Sister    Breast cancer Sister 26       stage III bilateral masterctomy, ? BRCA   Osteoporosis Maternal Grandmother     Review of Systems  All other systems reviewed and are negative.  Exam:    BP 138/76   Pulse (!) 110   Ht 5' 3.5" (1.613 m)   Wt 236 lb (107 kg)   LMP 06/13/2016 (Approximate)   SpO2 98%   BMI 41.15 kg/m     General appearance: alert, cooperative and appears stated age Head: normocephalic, without obvious abnormality, atraumatic Neck: no adenopathy, supple, symmetrical, trachea midline and thyroid normal to inspection and palpation Lungs: clear to auscultation bilaterally Breasts: normal appearance, no masses or tenderness, No nipple retraction or dimpling, No nipple discharge or bleeding, No axillary adenopathy Heart: regular rate and rhythm Abdomen: soft, non-tender; no masses, no organomegaly Extremities: extremities normal, atraumatic, no cyanosis or edema Skin: skin color, texture, turgor normal. No rashes or lesions Lymph nodes: cervical, supraclavicular, and axillary nodes normal. Neurologic: grossly normal  Pelvic: External genitalia:  periclitoral change with white coloration of tissue and minimal ecchymotic change.               No abnormal inguinal nodes palpated.              Urethra:  normal appearing urethra with no masses, tenderness or lesions              Bartholins and Skenes: normal                 Vagina: normal appearing vagina with normal color and discharge, no lesions              Cervix: no lesions              Pap taken: yes Bimanual Exam:  Uterus:  normal size, contour, position, consistency, mobility, non-tender              Adnexa: no mass, fullness, tenderness              Rectal exam: yes.  Confirms.              Anus:  normal sphincter tone, no lesions  Chaperone was present for exam:  yes.  Assessment:   Well woman visit with gynecologic exam. Status post LSO for left ovarian cyst.  1.1 cm left adnexal paratubal or paraovarian cyst or old endometriosis.  Uterine fibroids.  Hx endometriosis.  Status post hysteroscopy polypectomy.  FH breast cancer in sister and mother.  Neg BRCA testing in sister.  Lichen  sclerosus, biopsy confirmed from 03/15/17. Osteopenia.  Plan: Mammogram screening discussed. Self breast awareness reviewed. Pap and HR HPV as above. Guidelines for Calcium, Vitamin D, regular exercise program including cardiovascular and  weight bearing exercise. Refill of Clobetasol ointment.   BMD due.  Outside provider following.  Follow up annually and prn.   After visit summary provided.

## 2021-06-02 LAB — CYTOLOGY - PAP
Comment: NEGATIVE
Diagnosis: NEGATIVE
High risk HPV: NEGATIVE

## 2022-02-27 IMAGING — MG MM DIGITAL SCREENING BILAT W/ TOMO AND CAD
6 of 10 series · 6 of 30 positions shown · non-contrast
Comparison: Previous exam(s).

CLINICAL DATA: Screening.

EXAM:
DIGITAL SCREENING BILATERAL MAMMOGRAM WITH TOMOSYNTHESIS AND CAD
TECHNIQUE: Bilateral screening digital craniocaudal and mediolateral oblique
mammograms were obtained. Bilateral screening digital breast
tomosynthesis was performed. The images were evaluated with
computer-aided detection.

[R CC synth-2D]
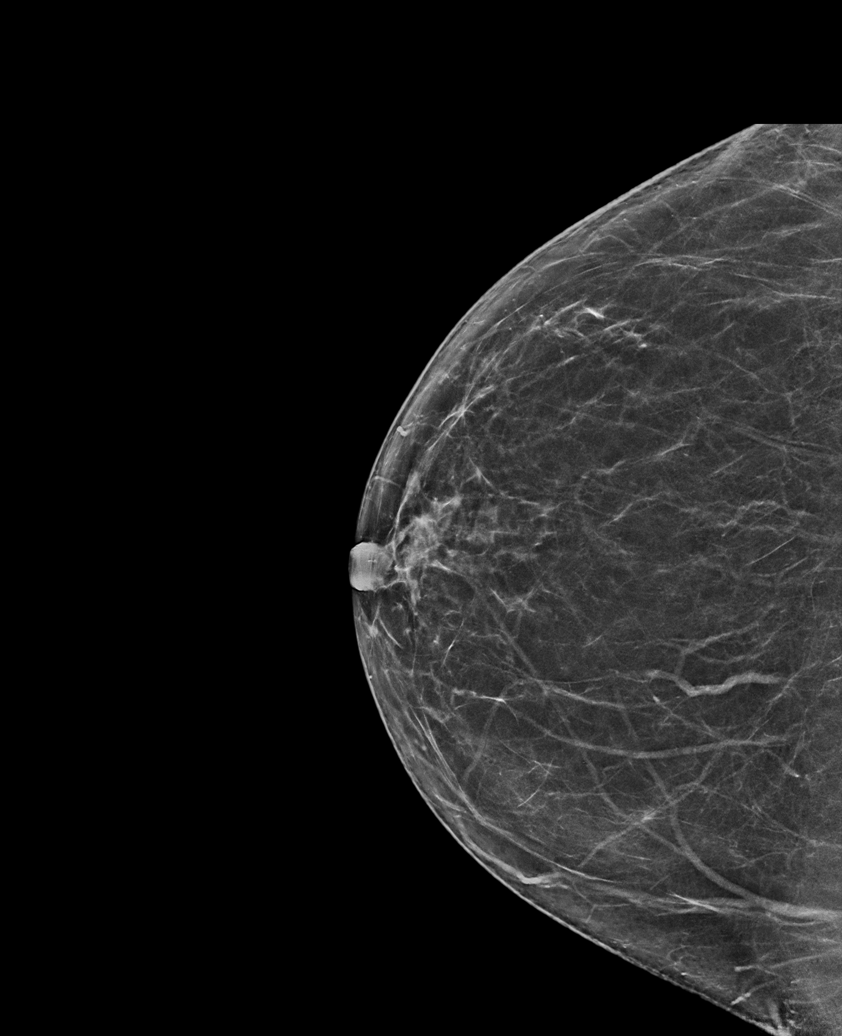

[R MLO synth-2D]
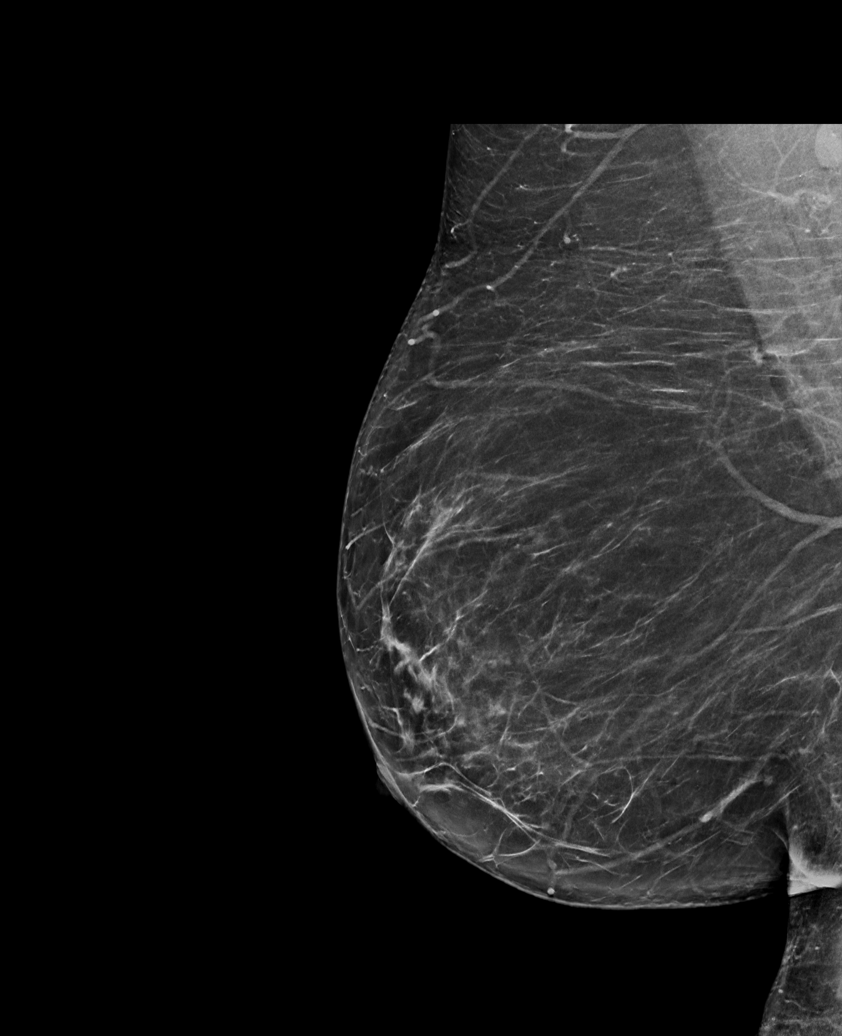

[L CC synth-2D]
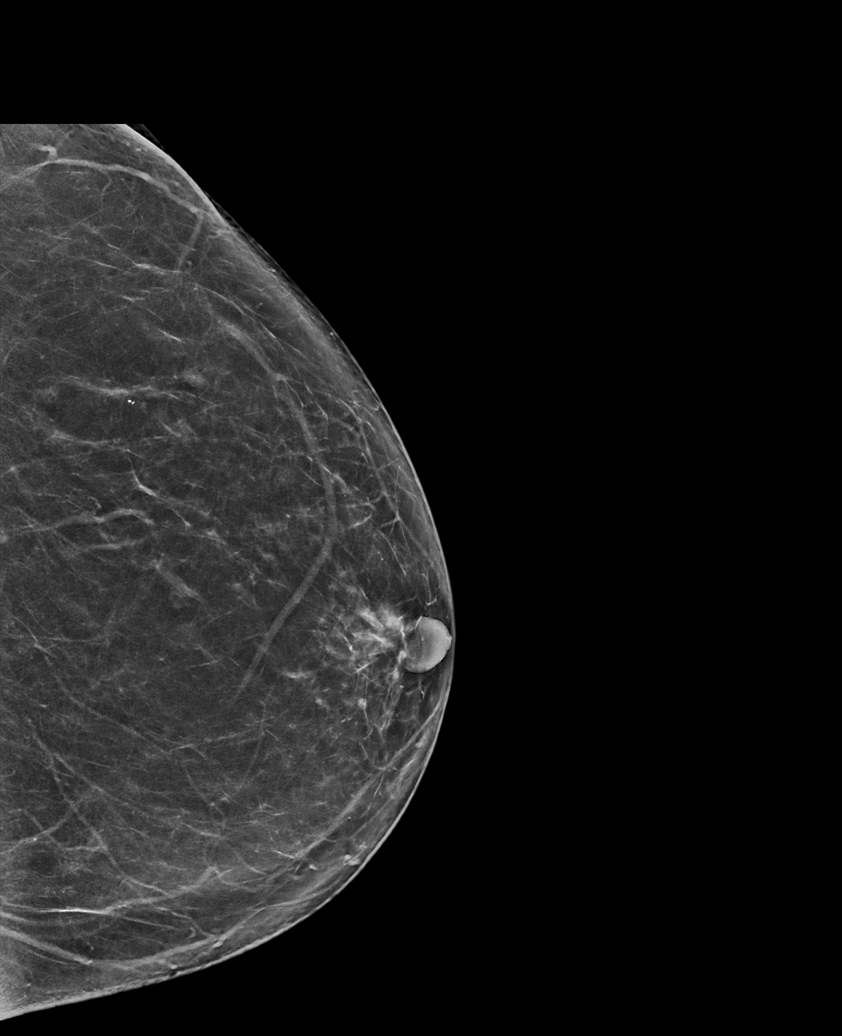

[R CV synth-2D]
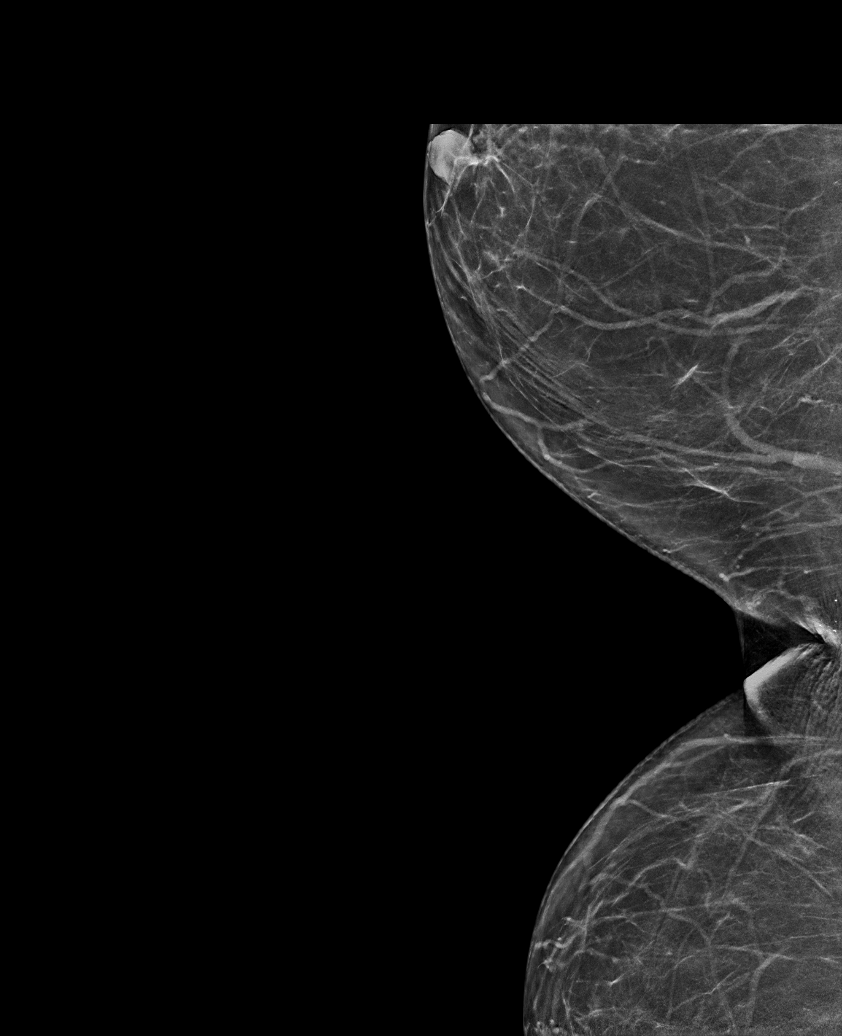

[L MLO synth-2D]
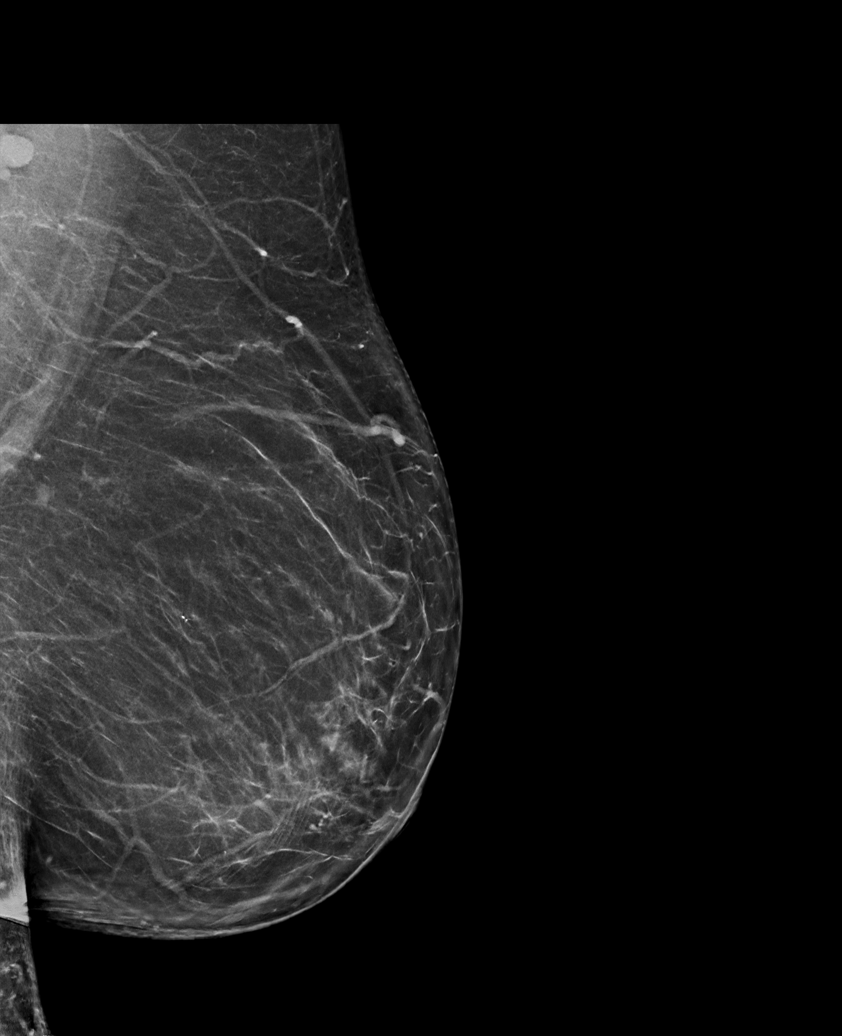

[R MLO tomo · tomo slice 41/80.0]
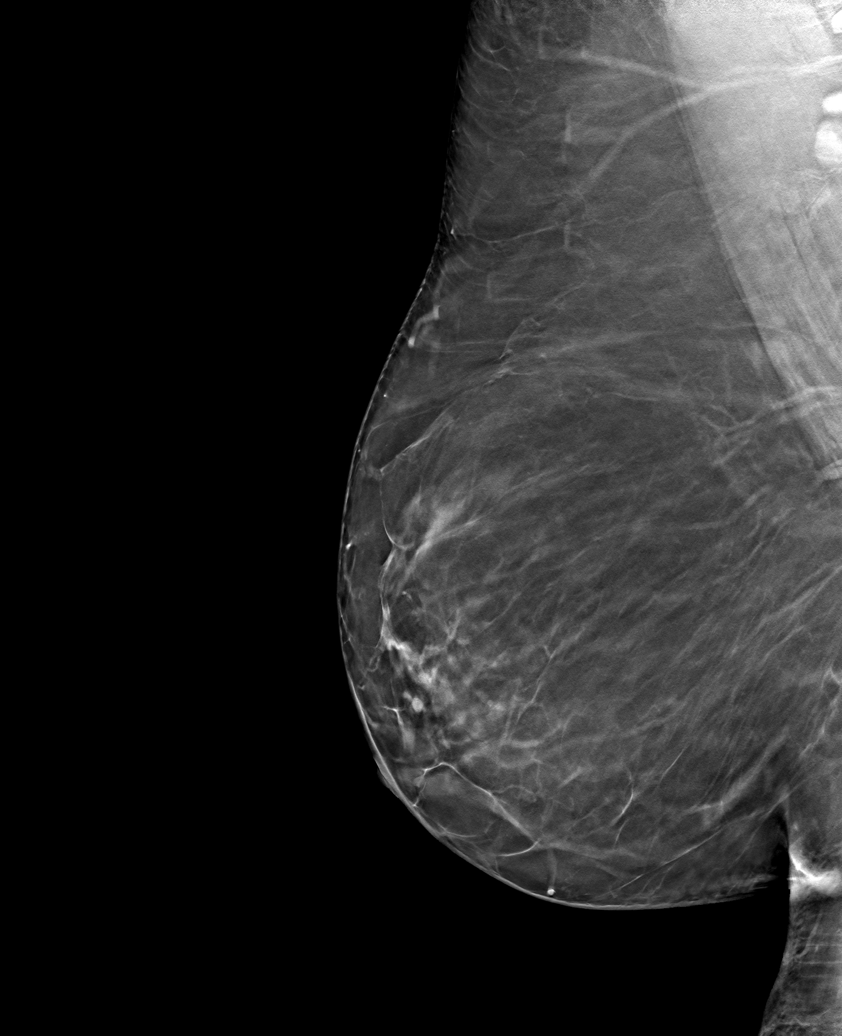

[6 of 30 positions shown; findings below may reference images not displayed]

ACR Breast Density Category b: There are scattered areas of
fibroglandular density.
FINDINGS: There are no findings suspicious for malignancy.
IMPRESSION: No mammographic evidence of malignancy. A result letter of this
screening mammogram will be mailed directly to the patient.

RECOMMENDATION:
Screening mammogram in one year. (Code:51-O-LD2)

BI-RADS CATEGORY  1: Negative.
